# Patient Record
Sex: Female | Born: 1980 | Race: White | Hispanic: No | Marital: Married | Smoking: Former smoker
Health system: Southern US, Community
[De-identification: ages and names within clinical notes are randomized; demographics above are authoritative.]

## PROBLEM LIST (undated history)

## (undated) DIAGNOSIS — D649 Anemia, unspecified: Secondary | ICD-10-CM

## (undated) DIAGNOSIS — E039 Hypothyroidism, unspecified: Secondary | ICD-10-CM

## (undated) DIAGNOSIS — E785 Hyperlipidemia, unspecified: Secondary | ICD-10-CM

## (undated) HISTORY — PX: ACHILLES TENDON LENGTHENING: SUR826

## (undated) HISTORY — PX: ORIF FEMORAL SHAFT FRACTURE W/ PLATES AND SCREWS: SUR931

## (undated) HISTORY — PX: LAPAROSCOPIC GASTRIC BYPASS: SUR771

## (undated) HISTORY — DX: Hyperlipidemia, unspecified: E78.5

## (undated) HISTORY — DX: Anemia, unspecified: D64.9

## (undated) HISTORY — PX: BREAST REDUCTION SURGERY: SHX8

## (undated) HISTORY — PX: CHOLECYSTECTOMY: SHX55

## (undated) HISTORY — DX: Hypothyroidism, unspecified: E03.9

---

## 2014-01-29 ENCOUNTER — Ambulatory Visit: Payer: Self-pay | Admitting: Specialist

## 2014-01-29 DIAGNOSIS — Z01810 Encounter for preprocedural cardiovascular examination: Secondary | ICD-10-CM

## 2014-05-11 ENCOUNTER — Ambulatory Visit: Payer: Self-pay | Admitting: Specialist

## 2014-05-11 LAB — BASIC METABOLIC PANEL
Anion Gap: 6 — ABNORMAL LOW (ref 7–16)
BUN: 14 mg/dL (ref 7–18)
CO2: 26 mmol/L (ref 21–32)
CREATININE: 0.86 mg/dL (ref 0.60–1.30)
Calcium, Total: 8.6 mg/dL (ref 8.5–10.1)
Chloride: 108 mmol/L — ABNORMAL HIGH (ref 98–107)
EGFR (African American): >60
EGFR (Non-African Amer.): >60
Glucose: 108 mg/dL — ABNORMAL HIGH (ref 65–99)
Osmolality: 280 (ref 275–301)
Potassium: 4.1 mmol/L (ref 3.5–5.1)
SODIUM: 140 mmol/L (ref 136–145)

## 2014-05-25 ENCOUNTER — Inpatient Hospital Stay: Payer: Self-pay | Admitting: Specialist

## 2014-05-26 LAB — BASIC METABOLIC PANEL
Anion Gap: 9 (ref 7–16)
BUN: 6 mg/dL — AB (ref 7–18)
CALCIUM: 8 mg/dL — AB (ref 8.5–10.1)
CREATININE: 0.76 mg/dL (ref 0.60–1.30)
Chloride: 106 mmol/L (ref 98–107)
Co2: 22 mmol/L (ref 21–32)
EGFR (Non-African Amer.): >60
GLUCOSE: 91 mg/dL (ref 65–99)
OSMOLALITY: 271 (ref 275–301)
Potassium: 3.9 mmol/L (ref 3.5–5.1)
SODIUM: 137 mmol/L (ref 136–145)

## 2014-05-26 LAB — CBC WITH DIFFERENTIAL/PLATELET
Basophil #: 0.1 10*3/uL (ref 0.0–0.1)
Basophil %: 0.6 %
Eosinophil #: 0 10*3/uL (ref 0.0–0.7)
Eosinophil %: 0 %
HCT: 38.3 % (ref 35.0–47.0)
HGB: 12.7 g/dL (ref 12.0–16.0)
Lymphocyte #: 1.4 10*3/uL (ref 1.0–3.6)
Lymphocyte %: 11.3 %
MCH: 28.7 pg (ref 26.0–34.0)
MCHC: 33.2 g/dL (ref 32.0–36.0)
MCV: 86 fL (ref 80–100)
Monocyte #: 0.9 x10 3/mm (ref 0.2–0.9)
Monocyte %: 7.5 %
NEUTROS ABS: 9.9 10*3/uL — AB (ref 1.4–6.5)
Neutrophil %: 80.6 %
Platelet: 217 10*3/uL (ref 150–440)
RBC: 4.44 10*6/uL (ref 3.80–5.20)
RDW: 12.7 % (ref 11.5–14.5)
WBC: 12.3 10*3/uL — ABNORMAL HIGH (ref 3.6–11.0)

## 2014-05-26 LAB — PHOSPHORUS: Phosphorus: 2.4 mg/dL — ABNORMAL LOW (ref 2.5–4.9)

## 2014-05-26 LAB — ALBUMIN: Albumin: 3.1 g/dL — ABNORMAL LOW (ref 3.4–5.0)

## 2014-05-26 LAB — MAGNESIUM: MAGNESIUM: 1.8 mg/dL

## 2015-02-19 NOTE — Op Note (Signed)
PATIENT NAME:  Sarah Hess, HOSEK MR#:  [id number] DATE OF BIRTH:  Jul 05, 1981  DATE OF PROCEDURE:  05/25/2014  PREOPERATIVE DIAGNOSIS: Morbid obesity.   POSTOPERATIVE DIAGNOSIS: Morbid obesity.   PROCEDURE: Laparoscopic Roux-en-Y gastric bypass.   SURGEON: Primus Bravo, M.D.   ASSISTANT: Anabel Halon, PA-C.   COMPLICATIONS: None.   ANESTHESIA: General endotracheal.   CLINICAL INDICATIONS: See history and physical.   DETAILS OF PROCEDURE: The patient was taken to the operating room and placed on the operating table in the supine position.  Appropriate monitors and supplemental oxygen were delivered.  Broad-spectrum IV antibiotics were administered.  Sequential stockings were placed.  The abdomen was prepped and draped in the usual sterile fashion after the smooth induction of general anesthesia.  The abdomen was accessed using a 5-ml optical trocar in the left upper quadrant.  Pneumoperitoneum was established without difficulty.  Multiple other ports were placed in preparation for gastric bypass.  The small bowel was identified at the ligament of Treitz and run for 50 cm distal to that point.  It was then divided using an Echelon white load stapler reinforced with seam guard.  The Harmonic scalpel was used to take down the mesentery to its base.  A 125 cm Roux limb and 50 cm biliopancreatic limb was then created and a side-to-side jejunojejunostomy was created in the following fashion.  A tacking stitch was used using 2-0 Surgidac suture.  An enterotomy was made in both limbs and the Echelon white load stapler was inserted to its hub, clamped, and fired for its full 6-cm length.  The ensuing defect was closed using interrupted sutures to retract the enterotomy up and stapled across with the Echelon blue-load stapler with good effect.  A baby's butt stitch was placed times one using 2-0 Surgidac suture.  A stay suture was placed anteriorly using a 2-0 Surgidac suture and the mesenteric defect was closed  using running 2-0 Surgidac suture.  The omentum was divided then in the midline to create a path for the Roux limb to be lifted and elevated up to the gastric pouch.  The liver retractor was placed and the liver was retracted anteriorly and cephalad.  The patient was placed in steep reverse Trendelenburg after the Roux limb was tacked to the underside of the stomach using a 2-0 Surgidac suture.  All structures were removed from the stomach.  The pars flaccida was taken down using a Harmonic scalpel to the second gastric vein.  At that point the Echelon gold load reinforced with seam guard was used to fire multiple times creating a small 30 to 45-ml pouch.  Hemostasis was excellent.  The posterior pouch was then freed of all fibrofatty tissue in preparation for anastomosis.  The small bowel Roux limb was then mobilized up into the upper abdomen and tacked to the left side of the pouch with an interrupted 2-0 Surgidac suture.  Enterotomies were made in the gastric pouch and in the small bowel and the Echelon blue load stapler was inserted to 28 mm and clamped.  This was then fired and the ensuing defect was closed using a layer of 2-0 Polysorb suture times one.  A 34 French blunt-tipped bougie was inserted trans-orally down past the anastomosis and the second layer of closure occurred with two running 2-0 Polysorb sutures from either side, creating a two-layer anastomosis.  The bougie was removed and the proximal Roux limb was clamped.  This was submerged underneath saline and endoscope was placed trans-orally and guided all  the way down to the anastomosis without difficulty.  The anastomosis was widely patent and hemostasis was excellent.  No leak was present under high-pressure insufflation with the proximal Roux limb clamp.  The endoscopes were removed and I re-gowned and re-gloved.  We took the omentum and draped that over the top of the anastomosis and sutured it in place using a 2-0 Surgidac suture.  The  Peterson's defect was then closed using running 2-0 Surgidac suture. A drain was placed in the left upper quadrant over the anastomosis of 19 [place] Blake.  The liver retractor and trocars were removed without incident.  No bleeding occurred from any of these sites.  The wounds were then closed using 4-0 Vicryl and Dermabond.  The patient tolerated the procedure well, arrived in the recovery room in stable condition, and will be admitted to my care.  ADDENDUM 1: No endoscopy was performed.   ADDENDUM 2: The leak test was performed through a 36 JamaicaFrench ViSiGi.   ADDENDUM 3: No significant hiatal hernia was present.    ____________________________ Primus BravoJon Telina Kleckley, MD jb:jr D: 05/25/2014 12:35:20 ET T: 05/25/2014 13:23:05 ET JOB#: [id number]  cc: Primus BravoJon Korbyn Chopin, MD, <Dictator> Feliciana RossettiGreg Grisso, M.D. Geoffry ParadiseJON M Federico Maiorino MD ELECTRONICALLY SIGNED 05/26/2014 8:48

## 2015-03-18 IMAGING — CR DG CHEST 2V
1 series · 2 of 2 positions shown · non-contrast
Comparison: None.

CLINICAL DATA: Preoperative evaluation.

EXAM:
CHEST  2 VIEW

[Series 1: pa · 0.17mm/px · 2 of 2 slices shown]
[im 1/2]
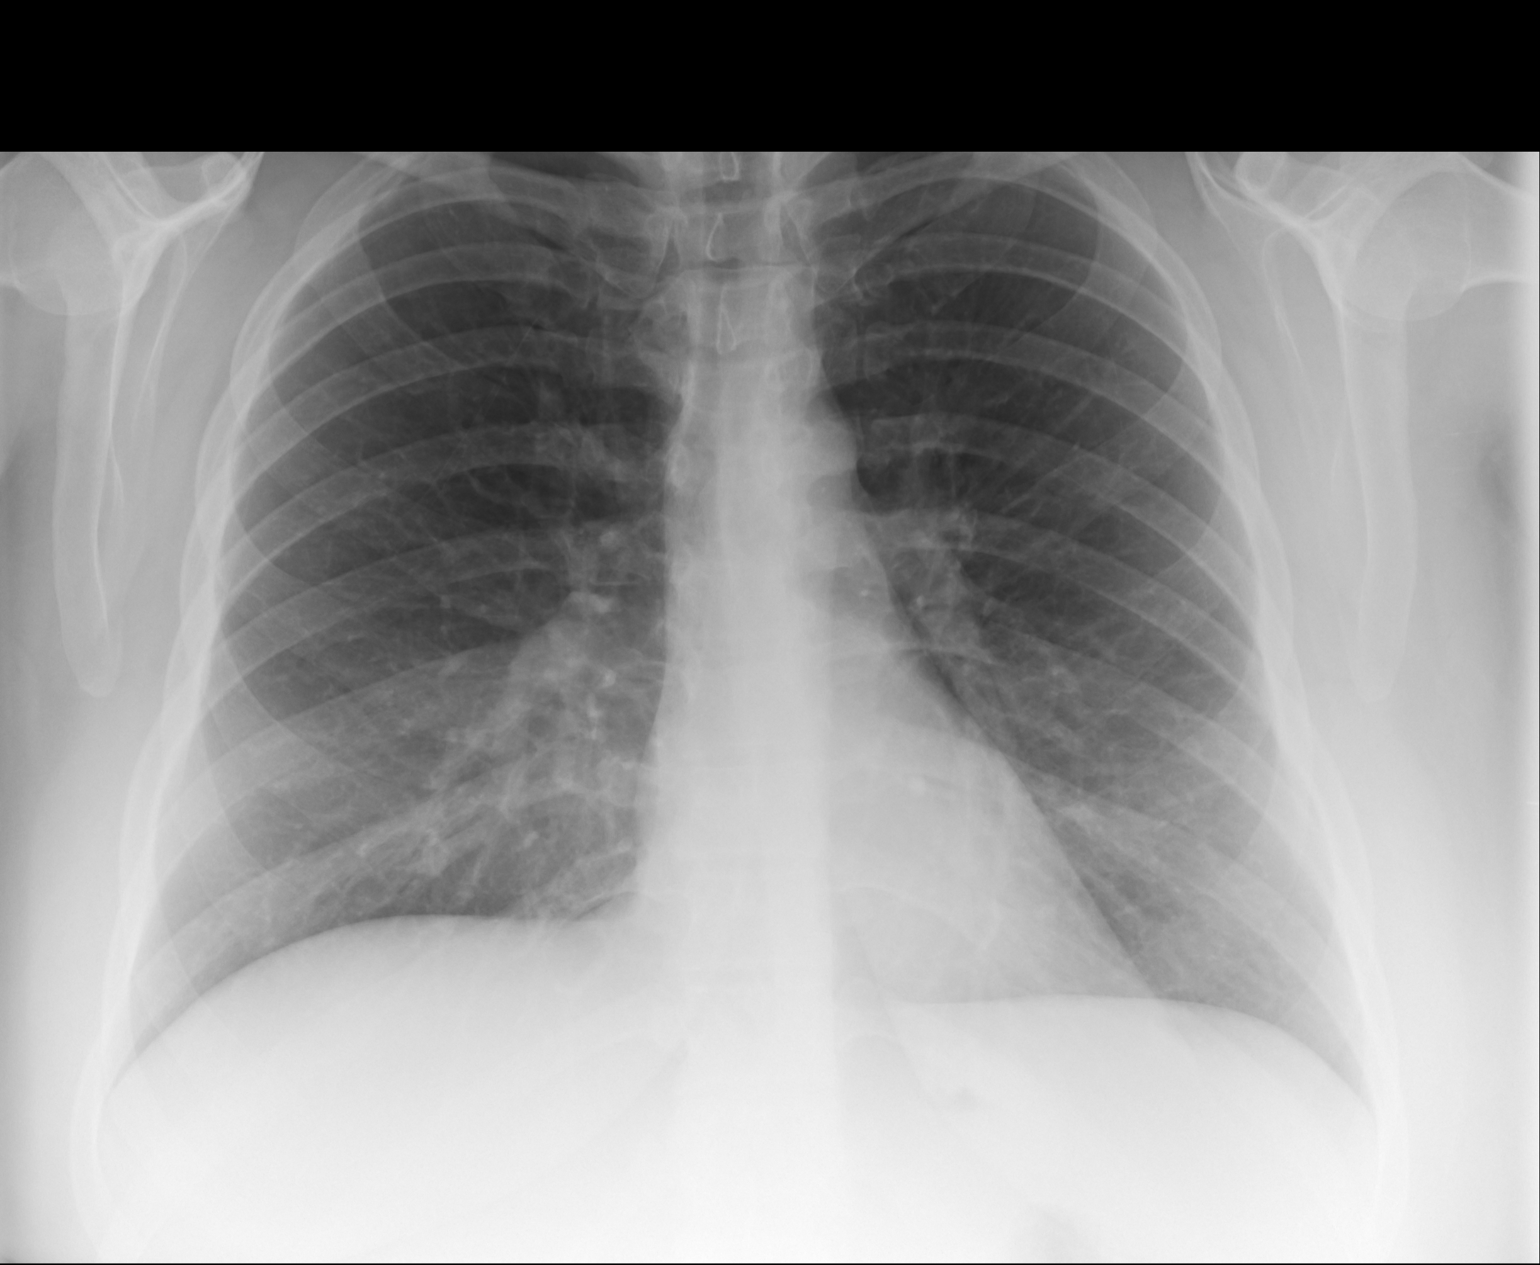
[im 2/2]
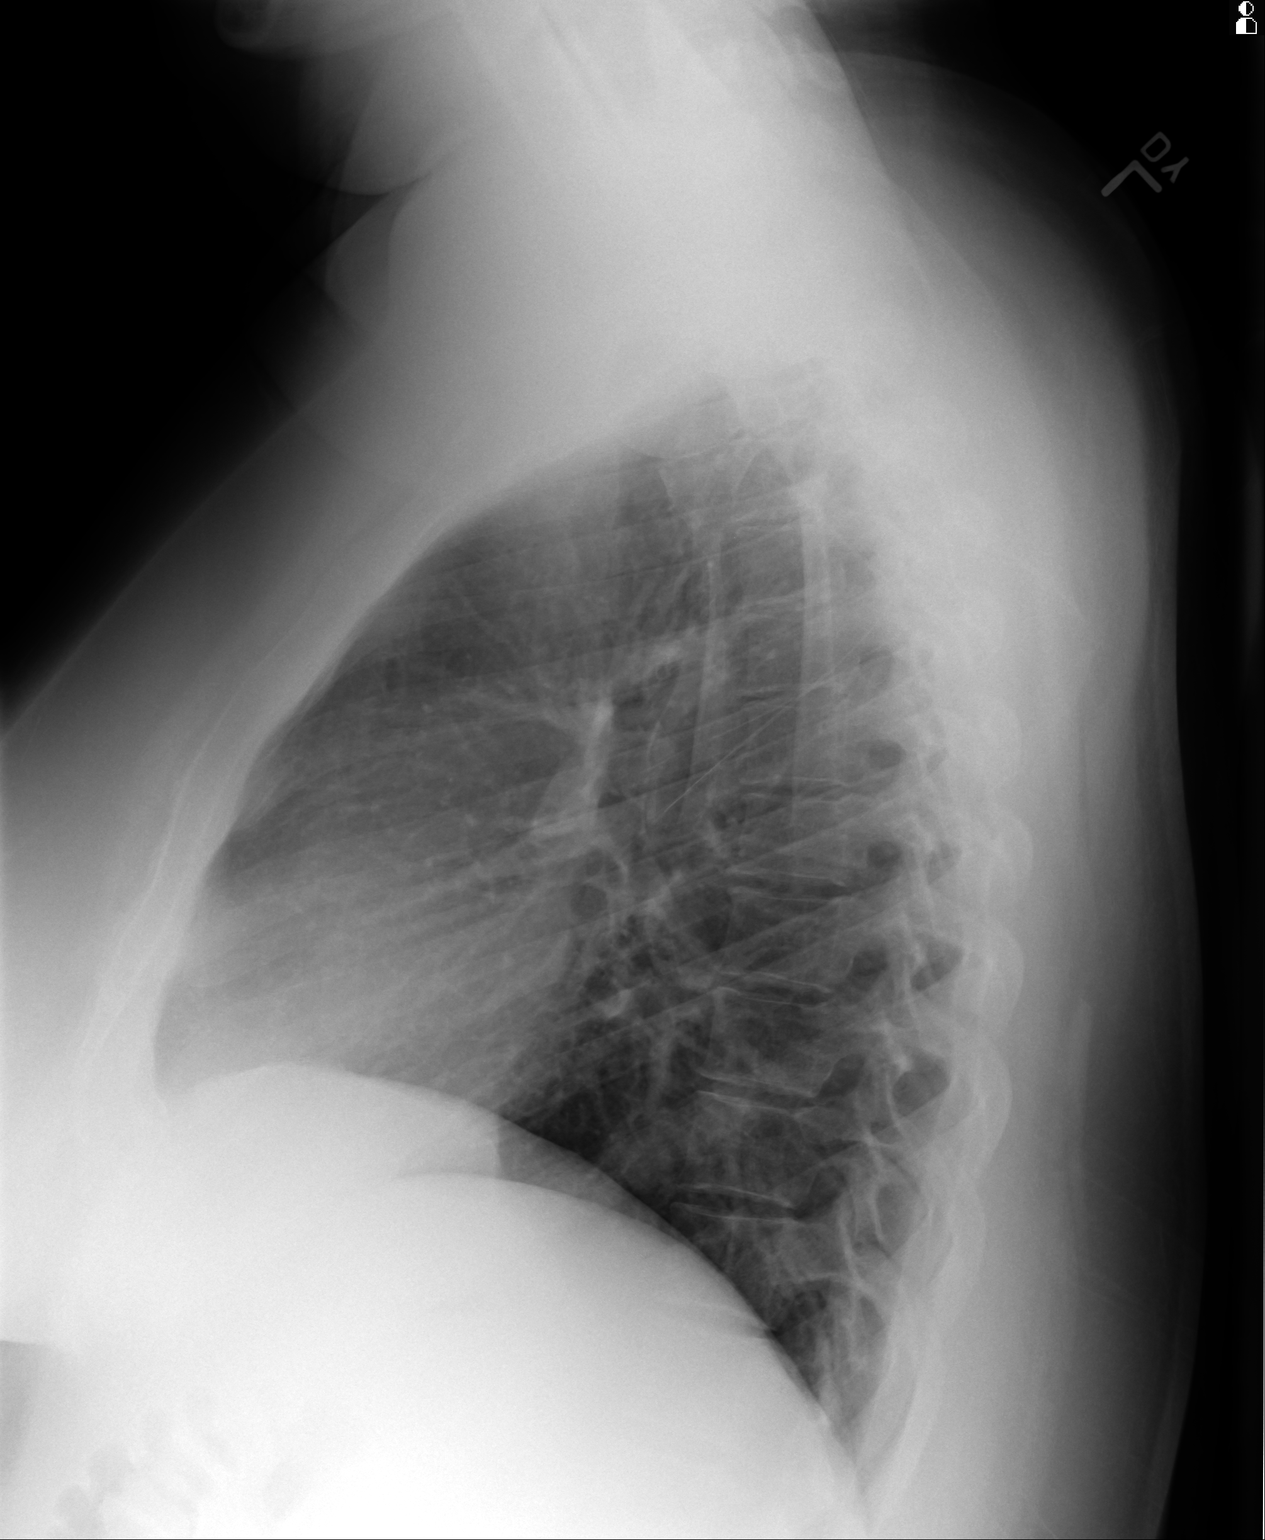

[2 of 2 positions shown; findings below may reference images not displayed]

FINDINGS: The heart size and mediastinal contours are within normal limits.
Both lungs are clear. The visualized skeletal structures are
unremarkable.
IMPRESSION: No active cardiopulmonary disease.

## 2015-07-15 ENCOUNTER — Other Ambulatory Visit: Payer: Self-pay | Admitting: Orthopaedic Surgery

## 2015-07-15 DIAGNOSIS — M5412 Radiculopathy, cervical region: Secondary | ICD-10-CM

## 2015-07-15 DIAGNOSIS — M542 Cervicalgia: Secondary | ICD-10-CM

## 2015-07-27 ENCOUNTER — Other Ambulatory Visit: Payer: Self-pay

## 2017-08-29 ENCOUNTER — Observation Stay (HOSPITAL_COMMUNITY): Payer: PRIVATE HEALTH INSURANCE | Admitting: Anesthesiology

## 2017-08-29 ENCOUNTER — Inpatient Hospital Stay (HOSPITAL_COMMUNITY)
Admission: AD | Admit: 2017-08-29 | Discharge: 2017-09-01 | DRG: 788 | Disposition: A | Discharge status: Home / Self Care | Payer: PRIVATE HEALTH INSURANCE | Source: Ambulatory Visit | Attending: Obstetrics and Gynecology | Admitting: Obstetrics and Gynecology

## 2017-08-29 ENCOUNTER — Encounter (HOSPITAL_COMMUNITY)
Admission: AD | Disposition: A | Discharge status: Home / Self Care | Payer: Self-pay | Source: Ambulatory Visit | Attending: Obstetrics and Gynecology

## 2017-08-29 ENCOUNTER — Encounter (HOSPITAL_COMMUNITY): Payer: Self-pay

## 2017-08-29 ENCOUNTER — Observation Stay (HOSPITAL_COMMUNITY): Payer: PRIVATE HEALTH INSURANCE

## 2017-08-29 DIAGNOSIS — O1414 Severe pre-eclampsia complicating childbirth: Principal | ICD-10-CM | POA: Diagnosis present

## 2017-08-29 DIAGNOSIS — E039 Hypothyroidism, unspecified: Secondary | ICD-10-CM | POA: Diagnosis present

## 2017-08-29 DIAGNOSIS — D649 Anemia, unspecified: Secondary | ICD-10-CM | POA: Diagnosis present

## 2017-08-29 DIAGNOSIS — O34211 Maternal care for low transverse scar from previous cesarean delivery: Secondary | ICD-10-CM | POA: Diagnosis present

## 2017-08-29 DIAGNOSIS — O9902 Anemia complicating childbirth: Secondary | ICD-10-CM | POA: Diagnosis present

## 2017-08-29 DIAGNOSIS — O365930 Maternal care for other known or suspected poor fetal growth, third trimester, not applicable or unspecified: Secondary | ICD-10-CM | POA: Diagnosis present

## 2017-08-29 DIAGNOSIS — Z3A29 29 weeks gestation of pregnancy: Secondary | ICD-10-CM | POA: Diagnosis not present

## 2017-08-29 DIAGNOSIS — Z87891 Personal history of nicotine dependence: Secondary | ICD-10-CM

## 2017-08-29 DIAGNOSIS — O99214 Obesity complicating childbirth: Secondary | ICD-10-CM | POA: Diagnosis present

## 2017-08-29 DIAGNOSIS — E669 Obesity, unspecified: Secondary | ICD-10-CM | POA: Diagnosis present

## 2017-08-29 DIAGNOSIS — Z7982 Long term (current) use of aspirin: Secondary | ICD-10-CM

## 2017-08-29 DIAGNOSIS — O1410 Severe pre-eclampsia, unspecified trimester: Secondary | ICD-10-CM | POA: Diagnosis present

## 2017-08-29 DIAGNOSIS — O1490 Unspecified pre-eclampsia, unspecified trimester: Secondary | ICD-10-CM | POA: Diagnosis present

## 2017-08-29 DIAGNOSIS — O328XX0 Maternal care for other malpresentation of fetus, not applicable or unspecified: Secondary | ICD-10-CM | POA: Diagnosis present

## 2017-08-29 DIAGNOSIS — O99844 Bariatric surgery status complicating childbirth: Secondary | ICD-10-CM | POA: Diagnosis present

## 2017-08-29 DIAGNOSIS — O99284 Endocrine, nutritional and metabolic diseases complicating childbirth: Secondary | ICD-10-CM | POA: Diagnosis present

## 2017-08-29 DIAGNOSIS — O1404 Mild to moderate pre-eclampsia, complicating childbirth: Secondary | ICD-10-CM | POA: Diagnosis present

## 2017-08-29 LAB — COMPREHENSIVE METABOLIC PANEL
ALT: 26 U/L (ref 14–54)
ANION GAP: 10 (ref 5–15)
AST: 32 U/L (ref 15–41)
Albumin: 2.8 g/dL — ABNORMAL LOW (ref 3.5–5.0)
Alkaline Phosphatase: 81 U/L (ref 38–126)
BUN: 14 mg/dL (ref 6–20)
CHLORIDE: 106 mmol/L (ref 101–111)
CO2: 20 mmol/L — AB (ref 22–32)
Calcium: 8.8 mg/dL — ABNORMAL LOW (ref 8.9–10.3)
Creatinine, Ser: 0.72 mg/dL (ref 0.44–1.00)
Glucose, Bld: 119 mg/dL — ABNORMAL HIGH (ref 65–99)
Potassium: 4.1 mmol/L (ref 3.5–5.1)
SODIUM: 136 mmol/L (ref 135–145)
Total Bilirubin: 0.2 mg/dL — ABNORMAL LOW (ref 0.3–1.2)
Total Protein: 6.4 g/dL — ABNORMAL LOW (ref 6.5–8.1)

## 2017-08-29 LAB — CBC
HCT: 29.8 % — ABNORMAL LOW (ref 36.0–46.0)
Hemoglobin: 10 g/dL — ABNORMAL LOW (ref 12.0–15.0)
MCH: 25.6 pg — AB (ref 26.0–34.0)
MCHC: 33.6 g/dL (ref 30.0–36.0)
MCV: 76.2 fL — ABNORMAL LOW (ref 78.0–100.0)
PLATELETS: 203 10*3/uL (ref 150–400)
RBC: 3.91 MIL/uL (ref 3.87–5.11)
RDW: 14.1 % (ref 11.5–15.5)
WBC: 14.6 10*3/uL — ABNORMAL HIGH (ref 4.0–10.5)

## 2017-08-29 LAB — T4, FREE: Free T4: 0.89 ng/dL (ref 0.61–1.12)

## 2017-08-29 SURGERY — Surgical Case
Anesthesia: Spinal | Wound class: Clean Contaminated

## 2017-08-29 MED ORDER — METHYLERGONOVINE MALEATE 0.2 MG PO TABS: 0.2000 mg | ORAL_TABLET | ORAL | Status: DC | PRN

## 2017-08-29 MED ORDER — DIPHENHYDRAMINE HCL 50 MG/ML IJ SOLN
12.5000 mg | INTRAMUSCULAR | Status: DC | PRN
  Administered 2017-08-29: 12.5 mg via INTRAVENOUS
  Filled 2017-08-29: qty 1

## 2017-08-29 MED ORDER — ZOLPIDEM TARTRATE 5 MG PO TABS: 5.0000 mg | ORAL_TABLET | Freq: Every evening | ORAL | Status: DC | PRN

## 2017-08-29 MED ORDER — LACTATED RINGERS IV SOLN
INTRAVENOUS | Status: DC | PRN
  Administered 2017-08-29: 40 [IU] via INTRAVENOUS

## 2017-08-29 MED ORDER — NALOXONE HCL 0.4 MG/ML IJ SOLN: 0.4000 mg | INTRAMUSCULAR | Status: DC | PRN

## 2017-08-29 MED ORDER — PHENYLEPHRINE 8 MG IN D5W 100 ML (0.08MG/ML) PREMIX OPTIME
INJECTION | INTRAVENOUS | Status: DC | PRN
  Administered 2017-08-29: 60 ug/min via INTRAVENOUS

## 2017-08-29 MED ORDER — FENTANYL CITRATE (PF) 100 MCG/2ML IJ SOLN
INTRAMUSCULAR | Status: AC
  Filled 2017-08-29: qty 2

## 2017-08-29 MED ORDER — SODIUM CHLORIDE 0.9 % IJ SOLN
INTRAMUSCULAR | Status: DC | PRN
  Administered 2017-08-29: 10 mL via INTRAVENOUS

## 2017-08-29 MED ORDER — BUPIVACAINE IN DEXTROSE 0.75-8.25 % IT SOLN
INTRATHECAL | Status: AC
  Filled 2017-08-29: qty 2

## 2017-08-29 MED ORDER — FENTANYL CITRATE (PF) 100 MCG/2ML IJ SOLN
INTRAMUSCULAR | Status: DC | PRN
  Administered 2017-08-29: 10 ug via INTRATHECAL
  Administered 2017-08-29: 80 ug via INTRAVENOUS

## 2017-08-29 MED ORDER — NALBUPHINE HCL 10 MG/ML IJ SOLN: 5.0000 mg | Freq: Once | INTRAMUSCULAR | Status: AC | PRN

## 2017-08-29 MED ORDER — ONDANSETRON HCL 4 MG/2ML IJ SOLN
INTRAMUSCULAR | Status: DC | PRN
  Administered 2017-08-29: 4 mg via INTRAVENOUS

## 2017-08-29 MED ORDER — OXYTOCIN 40 UNITS IN LACTATED RINGERS INFUSION - SIMPLE MED: 2.5000 [IU]/h | INTRAVENOUS | Status: AC

## 2017-08-29 MED ORDER — METHYLERGONOVINE MALEATE 0.2 MG/ML IJ SOLN: 0.2000 mg | INTRAMUSCULAR | Status: DC | PRN

## 2017-08-29 MED ORDER — DOCUSATE SODIUM 100 MG PO CAPS: 100.0000 mg | ORAL_CAPSULE | Freq: Every day | ORAL | Status: DC

## 2017-08-29 MED ORDER — SCOPOLAMINE 1 MG/3DAYS TD PT72: 1.0000 | MEDICATED_PATCH | Freq: Once | TRANSDERMAL | Status: DC

## 2017-08-29 MED ORDER — CEFAZOLIN SODIUM-DEXTROSE 2-4 GM/100ML-% IV SOLN
2.0000 g | INTRAVENOUS | Status: AC
  Administered 2017-08-29: 2 g via INTRAVENOUS
  Filled 2017-08-29: qty 100

## 2017-08-29 MED ORDER — SIMETHICONE 80 MG PO CHEW
80.0000 mg | CHEWABLE_TABLET | Freq: Three times a day (TID) | ORAL | Status: DC
  Administered 2017-08-30 – 2017-09-01 (×8): 80 mg via ORAL
  Filled 2017-08-29 (×8): qty 1

## 2017-08-29 MED ORDER — PROMETHAZINE HCL 25 MG/ML IJ SOLN: 6.2500 mg | INTRAMUSCULAR | Status: DC | PRN

## 2017-08-29 MED ORDER — DEXAMETHASONE SODIUM PHOSPHATE 10 MG/ML IJ SOLN
INTRAMUSCULAR | Status: AC
  Filled 2017-08-29: qty 1

## 2017-08-29 MED ORDER — DIBUCAINE 1 % RE OINT: 1.0000 "application " | TOPICAL_OINTMENT | RECTAL | Status: DC | PRN

## 2017-08-29 MED ORDER — DEXAMETHASONE SODIUM PHOSPHATE 10 MG/ML IJ SOLN
INTRAMUSCULAR | Status: DC | PRN
  Administered 2017-08-29: 10 mg via INTRAVENOUS

## 2017-08-29 MED ORDER — COCONUT OIL OIL
1.0000 "application " | TOPICAL_OIL | Status: DC | PRN
  Administered 2017-09-01: 1 via TOPICAL
  Filled 2017-08-29: qty 120

## 2017-08-29 MED ORDER — BUPIVACAINE HCL (PF) 0.25 % IJ SOLN
INTRAMUSCULAR | Status: DC | PRN
  Administered 2017-08-29: 30 mL

## 2017-08-29 MED ORDER — NALBUPHINE HCL 10 MG/ML IJ SOLN
5.0000 mg | Freq: Once | INTRAMUSCULAR | Status: AC | PRN
  Administered 2017-08-29: 5 mg via SUBCUTANEOUS

## 2017-08-29 MED ORDER — FENTANYL CITRATE (PF) 100 MCG/2ML IJ SOLN
25.0000 ug | INTRAMUSCULAR | Status: DC | PRN
  Administered 2017-08-29: 50 ug via INTRAVENOUS

## 2017-08-29 MED ORDER — LACTATED RINGERS IV SOLN: INTRAVENOUS | Status: DC

## 2017-08-29 MED ORDER — ONDANSETRON HCL 4 MG/2ML IJ SOLN
INTRAMUSCULAR | Status: AC
  Filled 2017-08-29: qty 2

## 2017-08-29 MED ORDER — LEVOTHYROXINE SODIUM 175 MCG PO TABS
175.0000 ug | ORAL_TABLET | Freq: Every day | ORAL | Status: DC
  Administered 2017-08-30 – 2017-09-01 (×3): 175 ug via ORAL
  Filled 2017-08-29 (×4): qty 1

## 2017-08-29 MED ORDER — MIDAZOLAM HCL 2 MG/2ML IJ SOLN
INTRAMUSCULAR | Status: AC
  Filled 2017-08-29: qty 2

## 2017-08-29 MED ORDER — ONDANSETRON HCL 4 MG/2ML IJ SOLN: 4.0000 mg | Freq: Three times a day (TID) | INTRAMUSCULAR | Status: DC | PRN

## 2017-08-29 MED ORDER — NALBUPHINE HCL 10 MG/ML IJ SOLN: 5.0000 mg | INTRAMUSCULAR | Status: DC | PRN

## 2017-08-29 MED ORDER — FAMOTIDINE 20 MG PO TABS
20.0000 mg | ORAL_TABLET | Freq: Once | ORAL | Status: AC
  Administered 2017-08-29: 20 mg via ORAL
  Filled 2017-08-29: qty 1

## 2017-08-29 MED ORDER — SODIUM CHLORIDE 0.9 % IR SOLN
Status: DC | PRN
  Administered 2017-08-29: 1000 mL

## 2017-08-29 MED ORDER — OXYCODONE-ACETAMINOPHEN 5-325 MG PO TABS
2.0000 | ORAL_TABLET | ORAL | Status: DC | PRN
Start: 2017-08-29 — End: 2017-09-01
  Administered 2017-08-30: 2 via ORAL
  Filled 2017-08-29: qty 2

## 2017-08-29 MED ORDER — MAGNESIUM SULFATE BOLUS VIA INFUSION
4.0000 g | Freq: Once | INTRAVENOUS | Status: AC
  Administered 2017-08-29: 4 g via INTRAVENOUS
  Filled 2017-08-29: qty 500

## 2017-08-29 MED ORDER — MEPERIDINE HCL 25 MG/ML IJ SOLN: 6.2500 mg | INTRAMUSCULAR | Status: DC | PRN

## 2017-08-29 MED ORDER — IBUPROFEN 600 MG PO TABS
600.0000 mg | ORAL_TABLET | Freq: Four times a day (QID) | ORAL | Status: DC
  Administered 2017-08-29 – 2017-09-01 (×10): 600 mg via ORAL
  Filled 2017-08-29 (×11): qty 1

## 2017-08-29 MED ORDER — MAGNESIUM SULFATE 40 G IN LACTATED RINGERS - SIMPLE
2.0000 g/h | INTRAVENOUS | Status: DC
  Administered 2017-08-29: 2 g/h via INTRAVENOUS
  Filled 2017-08-29: qty 40

## 2017-08-29 MED ORDER — SIMETHICONE 80 MG PO CHEW
80.0000 mg | CHEWABLE_TABLET | ORAL | Status: DC
  Administered 2017-08-29 – 2017-09-01 (×3): 80 mg via ORAL
  Filled 2017-08-29 (×3): qty 1

## 2017-08-29 MED ORDER — PRENATAL MULTIVITAMIN CH: 1.0000 | ORAL_TABLET | Freq: Every day | ORAL | Status: DC

## 2017-08-29 MED ORDER — ACETAMINOPHEN 325 MG PO TABS: 650.0000 mg | ORAL_TABLET | ORAL | Status: DC | PRN

## 2017-08-29 MED ORDER — NALOXONE HCL 2 MG/2ML IJ SOSY: 1.0000 ug/kg/h | PREFILLED_SYRINGE | INTRAVENOUS | Status: DC | PRN

## 2017-08-29 MED ORDER — SOD CITRATE-CITRIC ACID 500-334 MG/5ML PO SOLN
ORAL | Status: AC
  Administered 2017-08-29: 30 mL
  Filled 2017-08-29: qty 15

## 2017-08-29 MED ORDER — OXYCODONE-ACETAMINOPHEN 5-325 MG PO TABS
1.0000 | ORAL_TABLET | ORAL | Status: DC | PRN
  Administered 2017-08-30 – 2017-08-31 (×4): 1 via ORAL
  Filled 2017-08-29 (×4): qty 1

## 2017-08-29 MED ORDER — SCOPOLAMINE 1 MG/3DAYS TD PT72
MEDICATED_PATCH | TRANSDERMAL | Status: AC
  Filled 2017-08-29: qty 1

## 2017-08-29 MED ORDER — DIPHENHYDRAMINE HCL 25 MG PO CAPS
25.0000 mg | ORAL_CAPSULE | ORAL | Status: DC | PRN
  Administered 2017-08-30 (×2): 25 mg via ORAL
  Filled 2017-08-29 (×2): qty 1

## 2017-08-29 MED ORDER — LABETALOL HCL 100 MG PO TABS
100.0000 mg | ORAL_TABLET | Freq: Three times a day (TID) | ORAL | Status: DC
  Administered 2017-08-29 – 2017-08-31 (×6): 100 mg via ORAL
  Filled 2017-08-29 (×8): qty 1

## 2017-08-29 MED ORDER — BUPIVACAINE HCL (PF) 0.25 % IJ SOLN
INTRAMUSCULAR | Status: AC
  Filled 2017-08-29: qty 30

## 2017-08-29 MED ORDER — PRENATAL MULTIVITAMIN CH
1.0000 | ORAL_TABLET | Freq: Every day | ORAL | Status: DC
  Administered 2017-08-30 – 2017-09-01 (×3): 1 via ORAL
  Filled 2017-08-29 (×3): qty 1

## 2017-08-29 MED ORDER — SODIUM CHLORIDE 0.9% FLUSH: 3.0000 mL | INTRAVENOUS | Status: DC | PRN

## 2017-08-29 MED ORDER — CALCIUM CARBONATE ANTACID 500 MG PO CHEW: 2.0000 | CHEWABLE_TABLET | ORAL | Status: DC | PRN

## 2017-08-29 MED ORDER — SOD CITRATE-CITRIC ACID 500-334 MG/5ML PO SOLN: 30.0000 mL | Freq: Once | ORAL | Status: DC

## 2017-08-29 MED ORDER — DIPHENHYDRAMINE HCL 25 MG PO CAPS
25.0000 mg | ORAL_CAPSULE | Freq: Four times a day (QID) | ORAL | Status: DC | PRN
  Administered 2017-08-30: 25 mg via ORAL
  Filled 2017-08-29 (×3): qty 1

## 2017-08-29 MED ORDER — MORPHINE SULFATE (PF) 0.5 MG/ML IJ SOLN
INTRAMUSCULAR | Status: AC
  Filled 2017-08-29: qty 10

## 2017-08-29 MED ORDER — MIDAZOLAM HCL 2 MG/2ML IJ SOLN
INTRAMUSCULAR | Status: DC | PRN
  Administered 2017-08-29: 2 mg via INTRAVENOUS

## 2017-08-29 MED ORDER — SENNOSIDES-DOCUSATE SODIUM 8.6-50 MG PO TABS
2.0000 | ORAL_TABLET | ORAL | Status: DC
  Administered 2017-08-29 – 2017-09-01 (×3): 2 via ORAL
  Filled 2017-08-29 (×3): qty 2

## 2017-08-29 MED ORDER — MORPHINE SULFATE (PF) 0.5 MG/ML IJ SOLN
INTRAMUSCULAR | Status: DC | PRN
  Administered 2017-08-29: 1 mg via EPIDURAL
  Administered 2017-08-29: .2 mg via INTRATHECAL
  Administered 2017-08-29 (×2): 1 mg via EPIDURAL

## 2017-08-29 MED ORDER — OXYTOCIN 10 UNIT/ML IJ SOLN
INTRAMUSCULAR | Status: AC
  Filled 2017-08-29: qty 4

## 2017-08-29 MED ORDER — NALBUPHINE HCL 10 MG/ML IJ SOLN
INTRAMUSCULAR | Status: AC
  Filled 2017-08-29: qty 1

## 2017-08-29 MED ORDER — MENTHOL 3 MG MT LOZG: 1.0000 | LOZENGE | OROMUCOSAL | Status: DC | PRN

## 2017-08-29 MED ORDER — LACTATED RINGERS IV SOLN
INTRAVENOUS | Status: DC | PRN
  Administered 2017-08-29 (×2): via INTRAVENOUS

## 2017-08-29 MED ORDER — WITCH HAZEL-GLYCERIN EX PADS: 1.0000 "application " | MEDICATED_PAD | CUTANEOUS | Status: DC | PRN

## 2017-08-29 MED ORDER — ACETAMINOPHEN 500 MG PO TABS
1000.0000 mg | ORAL_TABLET | Freq: Four times a day (QID) | ORAL | Status: AC
  Administered 2017-08-29 – 2017-08-30 (×3): 1000 mg via ORAL
  Filled 2017-08-29 (×3): qty 2

## 2017-08-29 MED ORDER — ACETAMINOPHEN 325 MG PO TABS
650.0000 mg | ORAL_TABLET | ORAL | Status: DC | PRN
  Administered 2017-09-01: 650 mg via ORAL
  Filled 2017-08-29: qty 2

## 2017-08-29 MED ORDER — TETANUS-DIPHTH-ACELL PERTUSSIS 5-2.5-18.5 LF-MCG/0.5 IM SUSP: 0.5000 mL | Freq: Once | INTRAMUSCULAR | Status: DC

## 2017-08-29 MED ORDER — SIMETHICONE 80 MG PO CHEW: 80.0000 mg | CHEWABLE_TABLET | ORAL | Status: DC | PRN

## 2017-08-29 MED ORDER — SCOPOLAMINE 1 MG/3DAYS TD PT72
MEDICATED_PATCH | TRANSDERMAL | Status: DC | PRN
  Administered 2017-08-29: 1 via TRANSDERMAL

## 2017-08-29 SURGICAL SUPPLY — 38 items
CHLORAPREP W/TINT 26ML (MISCELLANEOUS) ×3 IMPLANT
CLAMP CORD UMBIL (MISCELLANEOUS) IMPLANT
CLOTH BEACON ORANGE TIMEOUT ST (SAFETY) ×3 IMPLANT
CONTAINER PREFILL 10% NBF 15ML (MISCELLANEOUS) IMPLANT
DERMABOND ADVANCED (GAUZE/BANDAGES/DRESSINGS) ×2
DERMABOND ADVANCED .7 DNX12 (GAUZE/BANDAGES/DRESSINGS) ×1 IMPLANT
DRSG OPSITE POSTOP 4X10 (GAUZE/BANDAGES/DRESSINGS) ×3 IMPLANT
ELECT REM PT RETURN 9FT ADLT (ELECTROSURGICAL) ×3
ELECTRODE REM PT RTRN 9FT ADLT (ELECTROSURGICAL) ×1 IMPLANT
EXTRACTOR VACUUM M CUP 4 TUBE (SUCTIONS) IMPLANT
EXTRACTOR VACUUM M CUP 4' TUBE (SUCTIONS)
GAUZE SPONGE 4X4 12PLY STRL LF (GAUZE/BANDAGES/DRESSINGS) ×6 IMPLANT
GLOVE BIO SURGEON STRL SZ7.5 (GLOVE) ×3 IMPLANT
GLOVE BIOGEL PI IND STRL 7.0 (GLOVE) ×1 IMPLANT
GLOVE BIOGEL PI INDICATOR 7.0 (GLOVE) ×2
GOWN STRL REUS W/TWL LRG LVL3 (GOWN DISPOSABLE) ×6 IMPLANT
KIT ABG SYR 3ML LUER SLIP (SYRINGE) IMPLANT
NEEDLE HYPO 22GX1.5 SAFETY (NEEDLE) ×3 IMPLANT
NEEDLE HYPO 25X5/8 SAFETYGLIDE (NEEDLE) ×3 IMPLANT
NEEDLE SPNL 20GX3.5 QUINCKE YW (NEEDLE) IMPLANT
NS IRRIG 1000ML POUR BTL (IV SOLUTION) ×3 IMPLANT
PACK C SECTION WH (CUSTOM PROCEDURE TRAY) ×3 IMPLANT
PAD ABD 7.5X8 STRL (GAUZE/BANDAGES/DRESSINGS) ×3 IMPLANT
PENCIL SMOKE EVAC W/HOLSTER (ELECTROSURGICAL) ×3 IMPLANT
SUT MNCRL 0 VIOLET CTX 36 (SUTURE) ×2 IMPLANT
SUT MNCRL AB 3-0 PS2 27 (SUTURE) ×3 IMPLANT
SUT MON AB 2-0 CT1 27 (SUTURE) ×3 IMPLANT
SUT MON AB-0 CT1 36 (SUTURE) ×6 IMPLANT
SUT MONOCRYL 0 CTX 36 (SUTURE) ×4
SUT PLAIN 0 NONE (SUTURE) IMPLANT
SUT PLAIN 2 0 (SUTURE)
SUT PLAIN 2 0 XLH (SUTURE) ×3 IMPLANT
SUT PLAIN ABS 2-0 CT1 27XMFL (SUTURE) IMPLANT
SYR 20CC LL (SYRINGE) IMPLANT
SYR CONTROL 10ML LL (SYRINGE) ×3 IMPLANT
TOWEL OR 17X24 6PK STRL BLUE (TOWEL DISPOSABLE) ×3 IMPLANT
TRAY FOLEY BAG SILVER LF 14FR (SET/KITS/TRAYS/PACK) ×3 IMPLANT
YANKAUER SUCT BULB TIP NO VENT (SUCTIONS) ×3 IMPLANT

## 2017-08-29 NOTE — Consult Note (Signed)
Maternal Fetal Medicine Consultation  Requesting Provider(s):Taavon Primary WU:[id number]:Taavon Reason for consultation: preeclampsia  HPI: 35yo P0101 at 29+0 weeks admitted with preeclampsia. She had normal BP (110-120s/60-70s) until 10/25, at which point they were 142/96. Betamethasone was given, a 24 hour urine was obtained and was very abnormal at 2800mg  protein in 24 hours. CBC and LFTs were normal yesterday, and are pending today. US by report shows low normal growth but AC <3rd percentile. This pregnancy was an ART pregnancy. NIPT and AFP were normal. ABS is positive for a cold agglutinin, titers very low throughout pregnancy Patient reports occasional headaches and prominent scotomata. Denies epigastric and RUQ pain  OB History: 35 week C/S for preeclampsia with oligohydramnios   PMH: Hypothyroid on 175mcg synthroid. Most recent TSH very elevated at 7.76 last week  PSH: ORIF, cholecystectomy, gastric bypass Meds: 81mg  ASA daily, 175mcg synthroid daily, PNV daily. 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 All other systems reviewed and are negative.  PE:  VS: See EPIC section GEN: well-appearing female ABD: gravid, NT NEURO: dtr 1+ at patella and biceps, 2 beats ankle clonus  A/P: Patient clearly has preeclampsia. She has a good understanding of the disease and its management after her first pregnancy. She understands she will probably need to be inhouse until delivery. Recommend platelets and LFTs every 3 days if today's are normal. 1 hours of EFM 2-3 times per day Neuroprotective MgSO4 if delivery is imminent prior to 32 weeks Full US scan with BPP on Monday 11/5 and begin weekly BPP at that point, dopplers [facility] low as described Draw free T4 ASAP due to highly elevated TSH  Thank you for the opportunity to be  a part of the care of Sarah Hess. Please contact our office if we can be of further assistance.   I spent approximately 30 minutes with this patient with over 50% of time spent in face-to-face counseling.

## 2017-08-29 NOTE — Transfer of Care (Signed)
Immediate Anesthesia Transfer of Care Note  Patient: Sarah Hess  Procedure(s) Performed: CESAREAN SECTION (N/A )  Patient Location: PACU  Anesthesia Type:Spinal  Level of Consciousness: awake, alert  and oriented  Airway & Oxygen Therapy: Patient Spontanous Breathing  Post-op Assessment: Report given to RN and Post -op Vital signs reviewed and stable  Post vital signs: Reviewed and stable  Last Vitals:  Vitals:   08/29/17 1505 08/29/17 1510  BP: 138/76 127/73  Pulse: 72 (!) 58  Resp: 16 18  Temp:    SpO2:      Last Pain:  Vitals:   08/29/17 1400  TempSrc:   PainSc: 0-No pain         Complications: No apparent anesthesia complications

## 2017-08-29 NOTE — Progress Notes (Signed)
Anesthesia notified in regards to c/s decision.  Carmelina DaneERRI L Deniel Mcquiston, RN

## 2017-08-29 NOTE — Anesthesia Procedure Notes (Signed)
Spinal  Patient location during procedure: OR Start time: 08/29/2017 4:05 PM End time: 08/29/2017 4:07 PM Staffing Anesthesiologist: Cecile HearingURK, Esmerelda Finnigan EDWARD Performed: anesthesiologist  Preanesthetic Checklist Completed: patient identified, surgical consent, pre-op evaluation, timeout performed, IV checked, risks and benefits discussed and monitors and equipment checked Spinal Block Patient position: sitting Prep: site prepped and draped and DuraPrep Patient monitoring: continuous pulse ox and blood pressure Approach: midline Location: L3-4 Injection technique: single-shot Needle Needle type: Pencan  Needle gauge: 24 G Assessment Sensory level: T6 Additional Notes Functioning IV was confirmed and monitors were applied. Sterile prep and drape, including hand hygiene, mask and sterile gloves were used. The patient was positioned and the spine was prepped. The skin was anesthetized with lidocaine.  Free flow of clear CSF was obtained prior to injecting local anesthetic into the CSF.  The spinal needle aspirated freely following injection.  The needle was carefully withdrawn.  The patient tolerated the procedure well. Consent was obtained prior to procedure with all questions answered and concerns addressed. Risks including but not limited to bleeding, infection, nerve damage, paralysis, failed block, inadequate analgesia, allergic reaction, high spinal, itching and headache were discussed and the patient wished to proceed.   Arrie AranStephen Ramesh Moan, MD

## 2017-08-29 NOTE — Progress Notes (Signed)
Dr. Billy Coastaavon notified of variable decels. No new orders given. Carmelina DaneERRI L Isahi Godwin, RN

## 2017-08-29 NOTE — H&P (Signed)
NAMLevora Angel:  Sarah Hess, Sarah Hess             ACCOUNT NO.:  [id number]  MEDICAL RECORD NO.:  [id number]  LOCATION:                                 FACILITY:  PHYSICIAN:  Lenoard Adenichard J. Viyaan Champine, M.D.     DATE OF BIRTH:  DATE OF ADMISSION:  08/29/2017 DATE OF DISCHARGE:                             HISTORY & PHYSICAL   ADMISSION DIAGNOSIS:  A 29-week intrauterine pregnancy with new onset preeclampsia without severe features.  The patient presented to the office on August 22, 2017, with elevated blood pressure of first time is pregnancy of 142/96, had negative preeclamptic labs, did a 24-hour urine that came back greater than 300 mg in a 24-hour specimen.  She was given betamethasone on October 29 and October 30, to follow up in the office on October 31 revealed blood pressure of 154/96 with an appropriately grown fetus and a lagging abdominal circumference in the 2nd percentile.  She is admitted at this time for further management and decision making regarding inpatient management, outpatient management and possible delivery.  ALLERGIES:  She has no known drug allergies.  MEDICATIONS:  Labetalol, Synthroid, baby aspirin, prenatal vitamins.  SOCIAL HISTORY:  She is a nonsmoker, nondrinker.  She denies domestic physical violence.  She has a previous history of an urgent C-section at 36 weeks for severe preeclampsia, oligohydramnios in her first pregnancy in 2013.  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  FAMILY HISTORY:  Remarkable for diabetes and heart disease.  PHYSICAL EXAMINATION:  GENERAL:  She is a well-developed, well-nourished white female, in no acute distress. HEENT:  Normal. NECK:  Supple.  Full range of motion. LUNGS:  Clear. HEART:   Regular rate and rhythm. ABDOMEN:  Soft, gravid, nontender. EXTREMITIES:  There are no cords. NEUROLOGIC:  Nonfocal. SKIN:  Intact.  DTRs 2+.  No evidence of clonus.  REVIEW OF SYSTEMS:  Otherwise negative.  The patient denies headache, visual symptoms, has had some occasional nausea and vomiting.  IMPRESSION: 1. Twenty-nine week intrauterine pregnancy. 2. New onset preeclampsia without severe features. 3. History of severe preeclampsia. 4. History of previous cesarean section. 5. Hypothyroidism, poorly controlled.  PLAN:  To admit.  MFM consult.  NSTs 3 times a day.  Monitor fetal surveillance closely.  Consider magnesium neuro prophylaxis. Betamethasone complete.  Delivery options discussed.     Lenoard Adenichard J. Davonta Stroot, M.D.     RJT/MEDQ  D:  08/29/2017  T:  08/29/2017  Job:  [id number]

## 2017-08-29 NOTE — Op Note (Signed)
Cesarean Section Procedure Note  Indications: malpresentation: Footling breech and non-reassuring fetal status  Pre-operative Diagnosis: 29 week 0 day pregnancy.  Post-operative Diagnosis: same  Surgeon: Lenoard AdenAAVON,Zanaya Baize J   Assistants: Ernestina PennaFogleman, MD  Anesthesia: Local anesthesia 0.25.% bupivacaine and Spinal anesthesia  ASA Class: 2  Procedure Details  The patient was seen in the Holding Room. The risks, benefits, complications, treatment options, and expected outcomes were discussed with the patient.  The patient concurred with the proposed plan, giving informed consent. The risks of anesthesia, infection, bleeding and possible injury to other organs discussed. Injury to bowel, bladder, or ureter with possible need for repair discussed. Possible need for transfusion with secondary risks of hepatitis or HIV acquisition discussed. Post operative complications to include but not limited to DVT, PE and Pneumonia noted. The site of surgery properly noted/marked. The patient was taken to Operating Room # 1, identified as Bradly BienenstockDanielle Gallaway and the procedure verified as C-Section Delivery. A Time Out was held and the above information confirmed.  After induction of anesthesia, the patient was draped and prepped in the usual sterile manner. A Pfannenstiel incision was made and carried down through the subcutaneous tissue to the fascia. Fascial incision was made and extended transversely using Mayo scissors. The fascia was separated from the underlying rectus tissue superiorly and inferiorly. The peritoneum was identified and entered. Peritoneal incision was extended longitudinally. The utero-vesical peritoneal reflection was incised transversely and the bladder flap was bluntly freed from the lower uterine segment. A low transverse uterine incision(Kerr hysterotomy) was made. Delivered from footling breech presentation was a  female with Apgar scores of pending at one minute and pending at five minutes. Bulb  suctioning gently performed. Neonatal team in attendance and newborn transported to NICU.After the umbilical cord was clamped (delayed) and cut cord blood was obtained for evaluation. Cord ph collected. The placenta was removed intact and appeared normal. The uterus was curetted with a dry lap pack. Good hemostasis was noted.The uterine outline, tubes and ovaries appeared normal. The uterine incision was closed with running locked sutures of 0 Monocryl x 2 layers. Hemostasis was observed. Lavage was carried out until clear.The parietal peritoneum was closed with a running 2-0 Monocryl suture. The fascia was then reapproximated with running sutures of 0 Monocryl. The skin was reapproximated with 3-0 monocryl after Byron closure with 2-0 plain.  Instrument, sponge, and needle counts were correct prior the abdominal closure and at the conclusion of the case.   Findings: As noted  Estimated Blood Loss:  600         Drains: foley                 Specimens: placenta to path                 Complications:  None; patient tolerated the procedure well.         Disposition: PACU - hemodynamically stable.         Condition: stable  Attending Attestation: I performed the procedure.

## 2017-08-29 NOTE — Anesthesia Preprocedure Evaluation (Signed)
Anesthesia Evaluation  Patient identified by MRN, date of birth, ID band Patient awake    Reviewed: Allergy & Precautions, NPO status , Patient's Chart, lab work & pertinent test results, reviewed documented beta blocker date and time   Airway Mallampati: II  TM Distance: >3 FB Neck ROM: Full    Dental  (+) Teeth Intact, Dental Advisory Given   Pulmonary neg pulmonary ROS, former smoker,    Pulmonary exam normal breath sounds clear to auscultation       Cardiovascular hypertension (Pre-eclampsia), Pt. on home beta blockers Normal cardiovascular exam Rhythm:Regular Rate:Normal     Neuro/Psych negative neurological ROS     GI/Hepatic negative GI ROS, Neg liver ROS,   Endo/Other  Hypothyroidism Obesity   Renal/GU negative Renal ROS     Musculoskeletal negative musculoskeletal ROS (+)   Abdominal   Peds  Hematology negative hematology ROS (+) Blood dyscrasia, anemia , Plt 203k   Anesthesia Other Findings Day of surgery medications reviewed with the patient.  Reproductive/Obstetrics (+) Pregnancy 29 wks G2P0101, IUGR, preeclampsia                             Anesthesia Physical Anesthesia Plan  ASA: III and emergent  Anesthesia Plan: Spinal   Post-op Pain Management:    Induction:   PONV Risk Score and Plan: 3 and Ondansetron, Dexamethasone, Scopolamine patch - Pre-op and Treatment may vary due to age or medical condition  Airway Management Planned:   Additional Equipment:   Intra-op Plan:   Post-operative Plan:   Informed Consent: I have reviewed the patients History and Physical, chart, labs and discussed the procedure including the risks, benefits and alternatives for the proposed anesthesia with the patient or authorized representative who has indicated his/her understanding and acceptance.   Dental advisory given  Plan Discussed with: CRNA, Anesthesiologist and  Surgeon  Anesthesia Plan Comments: (Discussed risks and benefits of and differences between spinal and general. Discussed risks of spinal including headache, backache, failure, bleeding, infection, and nerve damage. Patient consents to spinal. Questions answered. Coagulation studies and platelet count acceptable.)        Anesthesia Quick Evaluation

## 2017-08-29 NOTE — Progress Notes (Signed)
Patient ID: Sarah BienenstockDanielle Hess, female   DOB: 1981/05/22, 36 y.o.   MRN: [id number] 29 wks G2P0101, IUGR, preeclampsia. Patient reports overall decreased FMs since 2 days. Felt some movements since being here.  No HA/ vision changes.  BP 126/78 (BP Location: Right Arm)   Pulse 74   Temp 98.1 F (36.7 C) (Oral)   Resp 18   Ht 5\' 10"  (1.778 m)   Wt 239 lb 8 oz (108.6 kg)   LMP 01/31/2017 (Approximate)   SpO2 98%   BMI 34.36 kg/m   Came to evaluate FHT.  160/ minimal variability/ intermittent variable decels with occasional late decel/ no accels. Category II No UCs  Labs pending.  MFM report reviewed, Free T4 added.   Start Magnesium for neuroprophylaxis, 4 gm bolus, then 2 gm/hr, strict I/O and mag checks in case emergent C/s needed if FHT in category III. Verbal consent for C-section discussed, patient agrees.  Left lateral position. 500 cc LR bolus.  NPO (ate lunch after admission) Inform NICU FHT not stable for transport  V.Juliene PinaMody, MD

## 2017-08-29 NOTE — Progress Notes (Signed)
Patient ID: Bradly BienenstockDanielle Hess, female   DOB: 1980/11/20, 36 y.o.   MRN: [id number] FHR 150-160 with minimal BTBV. Occ spontaneous deceleration and no accels Category 3 tracing Will proceed with csection OR aware Consent done.

## 2017-08-29 NOTE — Consult Note (Signed)
Neonatology Note:   Attendance at C-section:    I was asked by Dr. Billy Coastaavon to attend this C/S at 29 weeks due to worsening pre-eclampsia. The mother is a G2P0101, GBS unknown with good prenatal care complicated by oligohydramnios and hypothyroidism. S/p BTMZ early this week. ROM 0 hours before delivery, fluid clear. Infant somewhat vigorous with spontaneous cry and fair tone. +30 sec DCC.  After baby brought to warmer, needed minimal bulb suctioning to clear airway and then due to low HR ~60bpm, PPV started with good response and transition to cpap.  sao2 placed and in 80s at 1-2 min of life; fio2 titrated accordingly. Improved lung aeration equally and cpap for mild fio2 need at 5min.  Ap 4/8. Introduced to father and mother and accompanied by dad to NICU for continued management.   Sarah Kidavid C. Leary RocaEhrmann, MD

## 2017-08-30 ENCOUNTER — Encounter (HOSPITAL_COMMUNITY): Payer: Self-pay | Admitting: Obstetrics and Gynecology

## 2017-08-30 DIAGNOSIS — O1410 Severe pre-eclampsia, unspecified trimester: Secondary | ICD-10-CM | POA: Diagnosis present

## 2017-08-30 LAB — COMPREHENSIVE METABOLIC PANEL
ALT: 21 U/L (ref 14–54)
ANION GAP: 5 (ref 5–15)
AST: 24 U/L (ref 15–41)
Albumin: 2.3 g/dL — ABNORMAL LOW (ref 3.5–5.0)
Alkaline Phosphatase: 62 U/L (ref 38–126)
BILIRUBIN TOTAL: 0.4 mg/dL (ref 0.3–1.2)
BUN: 15 mg/dL (ref 6–20)
CHLORIDE: 105 mmol/L (ref 101–111)
CO2: 22 mmol/L (ref 22–32)
Calcium: 8.1 mg/dL — ABNORMAL LOW (ref 8.9–10.3)
Creatinine, Ser: 0.79 mg/dL (ref 0.44–1.00)
Glucose, Bld: 97 mg/dL (ref 65–99)
POTASSIUM: 4.7 mmol/L (ref 3.5–5.1)
Sodium: 132 mmol/L — ABNORMAL LOW (ref 135–145)
TOTAL PROTEIN: 5.1 g/dL — AB (ref 6.5–8.1)

## 2017-08-30 LAB — CBC
HEMATOCRIT: 24.5 % — AB (ref 36.0–46.0)
Hemoglobin: 8.1 g/dL — ABNORMAL LOW (ref 12.0–15.0)
MCH: 25.5 pg — ABNORMAL LOW (ref 26.0–34.0)
MCHC: 33.1 g/dL (ref 30.0–36.0)
MCV: 77 fL — AB (ref 78.0–100.0)
PLATELETS: 186 10*3/uL (ref 150–400)
RBC: 3.18 MIL/uL — ABNORMAL LOW (ref 3.87–5.11)
RDW: 14.2 % (ref 11.5–15.5)
WBC: 13.9 10*3/uL — ABNORMAL HIGH (ref 4.0–10.5)

## 2017-08-30 MED ORDER — FERROUS SULFATE 325 (65 FE) MG PO TABS
325.0000 mg | ORAL_TABLET | Freq: Every day | ORAL | Status: DC
  Administered 2017-08-30 – 2017-09-01 (×3): 325 mg via ORAL
  Filled 2017-08-30 (×3): qty 1

## 2017-08-30 MED ORDER — MAGNESIUM OXIDE 400 (241.3 MG) MG PO TABS
400.0000 mg | ORAL_TABLET | Freq: Two times a day (BID) | ORAL | Status: DC
  Administered 2017-08-30 – 2017-09-01 (×5): 400 mg via ORAL
  Filled 2017-08-30 (×7): qty 1

## 2017-08-30 MED ORDER — BUPIVACAINE IN DEXTROSE 0.75-8.25 % IT SOLN
INTRATHECAL | Status: DC | PRN
  Administered 2017-08-29: 1.6 mL via INTRATHECAL

## 2017-08-30 NOTE — Addendum Note (Signed)
Addendum  created 08/30/17 0803 by Angela AdamWrinkle, Gaylen Venning G, CRNA   Sign clinical note

## 2017-08-30 NOTE — Progress Notes (Signed)
POD #1  Pt notes minimal lochia, pain well controlled. tol reg po last night, no N/V. No HA, no vision change, no RUQ pain, + flatus, foley just out, no void yet. Ambulated w/o dizziness.  O: Vitals:   08/29/17 1830 08/29/17 1850 08/29/17 2041 08/30/17 0635  BP: 135/85 (!) 141/80 127/72 (!) 106/59  Pulse: (!) 54 60 (!) 54 (!) 57  Resp: 13 18 15    Temp:  98.5 F (36.9 C) 98.8 F (37.1 C) 98.5 F (36.9 C)  TempSrc:  Oral Oral Oral  SpO2: 98%  98% 98%  Weight:      Height:       Gen: well appearing, no distress CV: RRR Pulm CTAB  Abd: obese, appropriately tender, ND, active BS Inc: dressed no staining LE: NT, no edema  CBC  CBC Latest Ref Rng & Units 08/30/2017 08/29/2017 05/26/2014  WBC 4.0 - 10.5 K/uL 13.9(H) 14.6(H) 12.3(H)  Hemoglobin 12.0 - 15.0 g/dL 8.1(L) 10.0(L) 12.7  Hematocrit 36.0 - 46.0 % 24.5(L) 29.8(L) 38.3  Platelets 150 - 400 K/uL 186 203 217   CMP Latest Ref Rng & Units 08/29/2017 05/26/2014 05/11/2014  Glucose 65 - 99 mg/dL 829(F119(H) 91 621(H108(H)  BUN 6 - 20 mg/dL 14 6(L) 14  Creatinine 0.44 - 1.00 mg/dL 0.860.72 5.780.76 4.690.86  Sodium 135 - 145 mmol/L 136 137 140  Potassium 3.5 - 5.1 mmol/L 4.1 3.9 4.1  Chloride 101 - 111 mmol/L 106 106 108(H)  CO2 22 - 32 mmol/L 20(L) 22 26  Calcium 8.9 - 10.3 mg/dL 6.2(X8.8(L) 5.2(W8.0(L) 8.6  Total Protein 6.5 - 8.1 g/dL 6.4(L) - -  Total Bilirubin 0.3 - 1.2 mg/dL 4.1(L0.2(L) - -  Alkaline Phos 38 - 126 U/L 81 - -  AST 15 - 41 U/L 32 - -  ALT 14 - 54 U/L 26 - -    A/P: POD #1 s/p RCS due [facility] with severe features and non-reasurring fetal testing Post-op. Pt doing well, cont pain management, advance diet PEC, nl bps, nl labs, not on Mag, no si/sx PEC Anemia. Will start iron  Ezzard Ditmer A. 08/30/2017 8:08 AM

## 2017-08-30 NOTE — Anesthesia Postprocedure Evaluation (Signed)
Anesthesia Post Note  Patient: Bradly BienenstockDanielle Suliman  Procedure(s) Performed: CESAREAN SECTION (N/A )     Patient location during evaluation: PACU Anesthesia Type: Spinal Level of consciousness: oriented and awake and alert Pain management: pain level controlled Vital Signs Assessment: post-procedure vital signs reviewed and stable Respiratory status: spontaneous breathing, respiratory function stable and patient connected to nasal cannula oxygen Cardiovascular status: blood pressure returned to baseline and stable Postop Assessment: no headache, no backache, no apparent nausea or vomiting and spinal receding Anesthetic complications: no    Last Vitals:  Vitals:   08/29/17 2041 08/30/17 0635  BP: 127/72 (!) 106/59  Pulse: (!) 54 (!) 57  Resp: 15   Temp: 37.1 C 36.9 C  SpO2: 98% 98%    Last Pain:  Vitals:   08/30/17 0635  TempSrc: Oral  PainSc:                  Cecile HearingStephen Edward Turk

## 2017-08-30 NOTE — Anesthesia Postprocedure Evaluation (Signed)
Anesthesia Post Note  Patient: Bradly BienenstockDanielle Regino  Procedure(s) Performed: CESAREAN SECTION (N/A )     Patient location during evaluation: [facility] Anesthesia Type: Spinal Level of consciousness: awake and alert Pain management: satisfactory to patient Vital Signs Assessment: post-procedure vital signs reviewed and stable Respiratory status: spontaneous breathing Cardiovascular status: stable Postop Assessment: no headache, spinal receding, patient able to bend at knees and no apparent nausea or vomiting Anesthetic complications: no Comments: Pain score 3.    Last Vitals:  Vitals:   08/29/17 2041 08/30/17 0635  BP: 127/72 (!) 106/59  Pulse: (!) 54 (!) 57  Resp: 15   Temp: 37.1 C 36.9 C  SpO2: 98% 98%    Last Pain:  Vitals:   08/30/17 0635  TempSrc: Oral  PainSc:    Pain Goal:                 [facility] Center[facility]INKLE,Anneke Cundy

## 2017-08-30 NOTE — Lactation Note (Signed)
This note was copied from a baby's chart. Lactation Consultation Note  Patient Name: Sarah Hess ZOXWR'UToday's Date: 08/30/2017 Reason for consult: Initial assessment;NICU baby;Preterm <34wks;Other (Comment);Breast reduction (Lactation induction / breast reduction 18 years ago , high B/P , post Magso4 , B/P meds - Labetalol )  Baby is 29 0/7 weeks .  DEBP already set up. And per mom started pumping in the recovery room.  LC reviewed hand expressing by talking mom through steps, and mom was able  To hand express without hand on by LC . Small drop from the right , and none from the left.  LC reassured mom it can be a slow process , but beneficial prior to pumping and afterwards,  LC reviewed the setting on the DEBP and checked the flanges, the #24 F is a good fit and mom  Comfortable. Mom has labels , and yellow stickers for the 1st - 24 .  Per mom has to call  insurance company about her DEBP. LC [facility] / also due to the reduction.  Mother informed of post-discharge support and given phone number to the lactation department,  including services for phone call assistance; out-patient appointments; and breastfeeding support group.  List of other breastfeeding resources in the community given in the handout. Encouraged mother to call for  problems or concerns related to breastfeeding.   Maternal Data Has patient been taught Hand Expression?: Yes (right breast, small drop , left breast none ) Does the patient have breastfeeding experience prior to this delivery?: Yes  Feeding    LATCH Score                   Interventions Interventions: Breast feeding basics reviewed;Expressed milk  Lactation Tools Discussed/Used Tools: Pump Breast pump type: Double-Electric Breast Pump WIC Program: No Pump Review: Setup, frequency, and cleaning;Milk Storage Initiated by:: - LC reviewed DEBP already set up / MAI  Date initiated::  08/30/17   Consult Status Consult Status: Follow-up Date: 08/31/17 Follow-up type: In-patient    Sarah Hess 08/30/2017, 1:14 PM

## 2017-08-31 DIAGNOSIS — O9902 Anemia complicating childbirth: Secondary | ICD-10-CM | POA: Diagnosis present

## 2017-08-31 NOTE — Progress Notes (Signed)
Subjective: POD# 2 s/p RCS 29 wks due to [facility] with severe features and NRFHT Information for the patient's newborn:  Alis, Sawchuk [[id number]]  female   Baby name: Ashok Croon with mom in NICU at baby's bedside  Reports feeling better today, some soreness present. Feeding: breast - pumping for now. Patient reports tolerating PO.  Breast symptoms:small amount colostrum on R side (hx redux) Pain controlled with PO meds Denies HA/SOB/C/P/N/V/dizziness. Flatus present. She reports vaginal bleeding as normal, without clots.  She is ambulating, urinating without difficulty.     Objective:   VS:    Vitals:   08/30/17 2138 08/30/17 2140 08/31/17 0616 08/31/17 0748  BP: 110/63 110/63  122/70  Pulse:  (!) 57  67  Resp:   19 19  Temp:   98.1 F (36.7 C) 98.2 F (36.8 C)  TempSrc:   Oral Oral  SpO2:   95% 98%  Weight:      Height:         Intake/Output Summary (Last 24 hours) at 08/31/17 0915 Last data filed at 08/31/17 6568  Gross per 24 hour  Intake              480 ml  Output             4500 ml  Net            -4020 ml        Recent Labs  08/29/17 1349 08/30/17 0543  WBC 14.6* 13.9*  HGB 10.0* 8.1*  HCT 29.8* 24.5*  PLT 203 186   Hepatic Function Latest Ref Rng & Units 08/30/2017 08/29/2017 05/26/2014  Total Protein 6.5 - 8.1 g/dL 5.1(L) 6.4(L) -  Albumin 3.5 - 5.0 g/dL 2.3(L) 2.8(L) 3.1(L)  AST 15 - 41 U/L 24 32 -  ALT 14 - 54 U/L 21 26 -  Alk Phosphatase 38 - 126 U/L 62 81 -  Total Bilirubin 0.3 - 1.2 mg/dL 0.4 0.2(L) -     Blood type: --/--/B POS (11/01 1349)  Rubella:       Physical Exam:  General: alert, cooperative and no distress Abdomen: soft, nontender, normal bowel sounds Incision: clean, dry, intact and pressure dressing in place, will remove once mom back in her room Uterine Fundus: firm, below umbilicus, nontender Lochia: minimal Ext: no edema, redness or tenderness in the calves or thighs   Assessment/Plan: 36 y.o.   POD# 2.  [id number]                  Principal Problem:   Postpartum care following cesarean delivery (11/1) Active Problems:   Cesarean delivery delivered: indication: NRFHR, pre-eclampsia, footling breech   Severe preeclampsia   Maternal anemia, with delivery   Doing well, stable.    PEC resolving - normotensive and labs stable, good diuresis   Oral Fe supplement        Routine post-op care  Juliene Pina, CNM, MSN 08/31/2017, 9:15 AM

## 2017-08-31 NOTE — Progress Notes (Signed)
   08/31/17 1500  Vital Signs  BP (!) 116/56  BP Location Left Arm  Patient Position (if appropriate) Semi-fowlers  Pulse Rate 60  Pulse Rate Source Monitor  Labetolol held; Dr. Billy Coastaavon paged; awaiting return call from MD.

## 2017-09-01 MED ORDER — SIMETHICONE 80 MG PO CHEW: 80.0000 mg | CHEWABLE_TABLET | Freq: Three times a day (TID) | ORAL | 0 refills | Status: DC

## 2017-09-01 MED ORDER — ACETAMINOPHEN 325 MG PO TABS: 650.0000 mg | ORAL_TABLET | ORAL | Status: DC | PRN

## 2017-09-01 MED ORDER — COCONUT OIL OIL: 1.0000 "application " | TOPICAL_OIL | 0 refills | Status: DC | PRN

## 2017-09-01 MED ORDER — FERROUS SULFATE 325 (65 FE) MG PO TABS: 325.0000 mg | ORAL_TABLET | Freq: Every day | ORAL | 3 refills | Status: AC

## 2017-09-01 MED ORDER — OXYCODONE-ACETAMINOPHEN 5-325 MG PO TABS: 1.0000 | ORAL_TABLET | ORAL | 0 refills | Status: DC | PRN

## 2017-09-01 MED ORDER — MAGNESIUM OXIDE 400 (241.3 MG) MG PO TABS: 400.0000 mg | ORAL_TABLET | Freq: Two times a day (BID) | ORAL | Status: DC

## 2017-09-01 MED ORDER — SENNOSIDES-DOCUSATE SODIUM 8.6-50 MG PO TABS: 2.0000 | ORAL_TABLET | ORAL | Status: DC

## 2017-09-01 NOTE — Lactation Note (Addendum)
This note was copied from a baby's chart. Lactation Consultation Note  Patient Name: Sarah Hess VSYVG'C Date: 09/01/2017   NICU baby 83 hours old. Called to mom's room for DEBP prior to D/C. Discussed with mom that she will need to obtain rental pump from [place] until 1900.   Enc mom to take pumping kit with her to use with rental pump and with pumps in pumping rooms in NICU.   Maternal Data    Feeding Feeding Type: Donor Breast Milk Length of feed: 30 min  LATCH Score                   Interventions    Lactation Tools Discussed/Used     Consult Status      Andres Labrum 09/01/2017, 3:49 PM

## 2017-09-01 NOTE — Progress Notes (Signed)
Discharged home ambulatory in stable condition.

## 2017-09-01 NOTE — Progress Notes (Addendum)
   09/01/17 0654  Vital Signs  BP (!) 93/51  BP Location Left Arm  Patient Position (if appropriate) Semi-fowlers  BP Method Automatic  Pulse Rate 76  Pulse Rate Source Monitor  Temp 98 F (36.7 C)  Temp Source Oral  Oxygen Therapy  SpO2 100 %  O2 Device Room Air  Labetalol 100 mg held at 0600 hrs this AM d/t low BP. Pressure dressing removed as ordered and abdominal binder applied. Pt removed same because she had some discomfort with it. Pt stated that she will resume same this AM.

## 2017-09-01 NOTE — Progress Notes (Deleted)
Discharge instructions given and patient verbalized understanding of all instructions given. Rx's given to patient along with written copy of AVS.

## 2017-09-01 NOTE — Progress Notes (Signed)
Discharge instructions given to patient and she verbalized understanding of all instructions given. Written copy of AVS given to patient along with written Rx's.

## 2017-09-01 NOTE — Progress Notes (Signed)
Subjective: POD# 3 Information for the patient's newborn:  Sarah Hess, Sarah Hess [[id number]][[id number]]  female   Baby name: Sarah Hess  Reports feeling sore, has been taking NSAID sparingly (hx gastric bypass), refraining from Percocet, makes her too sleepy. Feeding: breast - pumping Patient reports tolerating PO.  Breast symptoms: + colostrum Denies HA/SOB/Hess/P/N/V/dizziness. Flatus present. She reports vaginal bleeding as normal, without clots.  She is ambulating, urinating without difficulty.     Objective:   VS:    Vitals:   08/31/17 2315 09/01/17 0014 09/01/17 0654 09/01/17 0849  BP: 138/85 (!) 101/55 (!) 93/51 115/63  Pulse: (!) 103 69 76 64  Resp:    18  Temp: 98.5 F (36.9 Hess) 98.2 F (36.8 Hess) 98 F (36.7 Hess) 98.5 F (36.9 Hess)  TempSrc: Oral Oral Oral Oral  SpO2: 100% 98% 100% 98%  Weight:      Height:          Intake/Output Summary (Last 24 hours) at 09/01/2017 0935 Last data filed at 09/01/2017 0000 Gross per 24 hour  Intake 500 ml  Output 700 ml  Net -200 ml        Recent Labs    08/29/17 1349 08/30/17 0543  WBC 14.6* 13.9*  HGB 10.0* 8.1*  HCT 29.8* 24.5*  PLT 203 186     Blood type: --/--/B POS (11/01 1349)  Rubella:   Immune    Physical Exam:  General: alert, cooperative and no distress Abdomen: soft, nontender, normal bowel sounds Incision: clean, dry, intact and .HB applied Uterine Fundus: firm, below umbilicus, nontender Lochia: minimal Ext: + 1 pedal edema, no redness or tenderness in the calves or thighs      Assessment/Plan: 36 y.o.   POD# 3. [id number]                  Principal Problem:   Postpartum care following cesarean delivery (11/1) Active Problems:   Cesarean delivery delivered: indication: NRFHR, pre-eclampsia, footling breech   Severe preeclampsia   Maternal anemia, with delivery   Doing well, stable.    DC home today.  Normotensive, labetalol held this AM d/t low BP, no neural s/s Will DC labetalol for now Pain control  suboptimal, advised APAP, can take 1/2 - 1 tab Roxycet q 4 hrs as needed Oral Fe and mag ox supplement for anemia, can add alfalfa 2000 mg daily, increase to full dose over course of 1 week F/U in office 1 week for BP check  Plan in consult with Dr. Alvin Critchleyaavon  Sarah Hess Analyce Hess, CNM, MSN 09/01/2017, 9:35 AM

## 2017-09-01 NOTE — Discharge Summary (Signed)
OB Discharge Summary     Patient Name: Sarah Hess DOB: 01-Jul-1981 MRN: [id number]  Date of admission: 08/29/2017 Delivering MD: Olivia MackieAAVON, RICHARD   Date of discharge: 09/01/2017  Admitting diagnosis: 29WKS, ELEVATED BP Intrauterine pregnancy: 579w0d     Secondary diagnosis:  Principal Problem:   Postpartum care following cesarean delivery (11/1) Active Problems:   Cesarean delivery delivered: indication: NRFHR, pre-eclampsia, footling breech   Severe preeclampsia   Maternal anemia, with delivery      Discharge diagnosis: Term Pregnancy Delivered and Anemia                                   Complications: None  Hospital course:  Sceduled C/S   36 y.o. yo G1P0101 at 1279w0d was admitted to the hospital 08/29/2017 for cesarean section with the following indication:Prior Uterine Surgery, severe preeclampsia, fetal indication - non-reassuring fetal testing, breech presentation.  Membrane Rupture Time/Date: 4:34 PM ,08/29/2017   Patient delivered a Viable infant.08/29/2017  Details of operation can be found in separate operative note.  Pateint had an uncomplicated postpartum course.  She is ambulating, tolerating a regular diet, passing flatus, and urinating well. Patient is discharged home in stable condition on  09/01/17         Physical exam  Vitals:   08/31/17 2315 09/01/17 0014 09/01/17 0654 09/01/17 0849  BP: 138/85 (!) 101/55 (!) 93/51 115/63  Pulse: (!) 103 69 76 64  Resp:    18  Temp: 98.5 F (36.9 C) 98.2 F (36.8 C) 98 F (36.7 C) 98.5 F (36.9 C)  TempSrc: Oral Oral Oral Oral  SpO2: 100% 98% 100% 98%  Weight:      Height:       See progress note  Labs: Lab Results  Component Value Date   WBC 13.9 (H) 08/30/2017   HGB 8.1 (L) 08/30/2017   HCT 24.5 (L) 08/30/2017   MCV 77.0 (L) 08/30/2017   PLT 186 08/30/2017   CMP Latest Ref Rng & Units 08/30/2017  Glucose 65 - 99 mg/dL 97  BUN 6 - 20 mg/dL 15  Creatinine 4.090.44 - 8.111.00 mg/dL 9.140.79  Sodium 782135 - 956145 mmol/L  132(L)  Potassium 3.5 - 5.1 mmol/L 4.7  Chloride 101 - 111 mmol/L 105  CO2 22 - 32 mmol/L 22  Calcium 8.9 - 10.3 mg/dL 8.1(L)  Total Protein 6.5 - 8.1 g/dL 5.1(L)  Total Bilirubin 0.3 - 1.2 mg/dL 0.4  Alkaline Phos 38 - 126 U/L 62  AST 15 - 41 U/L 24  ALT 14 - 54 U/L 21    Discharge instruction: per After Visit Summary and [facility] booklet   After visit meds:  Allergies as of 09/01/2017      Reactions   Pineapple Anaphylaxis      Medication List    STOP taking these medications   labetalol 100 MG tablet Commonly known as:  NORMODYNE     TAKE these medications   acetaminophen 325 MG tablet Commonly known as:  TYLENOL Take 2 tablets (650 mg total) every 4 (four) hours as needed by mouth (for pain scale < 4).   aspirin 81 MG chewable tablet Chew 81 mg by mouth daily.   coconut oil Oil Apply 1 application as needed topically.   ferrous sulfate 325 (65 FE) MG tablet Take 1 tablet (325 mg total) daily with breakfast by mouth. Start taking on:  09/02/2017   levothyroxine 175  MCG tablet Commonly known as:  SYNTHROID, LEVOTHROID Take 175 mcg by mouth daily before breakfast.   magnesium oxide 400 (241.3 Mg) MG tablet Commonly known as:  MAG-OX Take 1 tablet (400 mg total) 2 (two) times daily by mouth.   oxyCODONE-acetaminophen 5-325 MG tablet Commonly known as:  PERCOCET/ROXICET Take 1 tablet every 4 (four) hours as needed by mouth (pain scale 4-7).   prenatal multivitamin Tabs tablet Take 1 tablet by mouth daily at 12 noon.   senna-docusate 8.6-50 MG tablet Commonly known as:  Senokot-S Take 2 tablets daily by mouth. Start taking on:  09/02/2017   simethicone 80 MG chewable tablet Commonly known as:  MYLICON Chew 1 tablet (80 mg total) 3 (three) times daily after meals by mouth.       Diet: routine diet  Activity: Advance as tolerated. Pelvic rest for 6 weeks.   Outpatient follow up: 1 week in office BP check  Postpartum contraception: Not  Discussed  Newborn Data: Live born female Sarah Hess Birth Weight: 1 lb 13.3 oz (830 g) APGAR: 4, 8  Newborn Delivery   Birth date/time:  08/29/2017 16:34:00 Delivery type:  C-Section, Low Transverse C-section categorization:  Repeat     Baby Feeding: donor milk Disposition:NICU   09/01/2017 Neta Mends, CNM

## 2017-09-02 LAB — TYPE AND SCREEN
ABO/RH(D): B POS
Antibody Screen: POSITIVE
Donor AG Type: NEGATIVE
Donor AG Type: NEGATIVE
PT AG TYPE: NEGATIVE
UNIT DIVISION: 0
Unit division: 0

## 2017-09-02 LAB — BPAM RBC
Blood Product Expiration Date: 201811142359
Blood Product Expiration Date: 201811212359
UNIT TYPE AND RH: 7300
Unit Type and Rh: 5100

## 2019-06-05 DIAGNOSIS — D509 Iron deficiency anemia, unspecified: Secondary | ICD-10-CM | POA: Diagnosis not present

## 2024-04-20 ENCOUNTER — Inpatient Hospital Stay: Attending: Hematology and Oncology | Admitting: Hematology and Oncology

## 2024-04-20 ENCOUNTER — Inpatient Hospital Stay

## 2024-04-20 ENCOUNTER — Other Ambulatory Visit: Payer: Self-pay | Admitting: Hematology and Oncology

## 2024-04-20 ENCOUNTER — Telehealth: Payer: Self-pay | Admitting: Hematology and Oncology

## 2024-04-20 ENCOUNTER — Other Ambulatory Visit: Payer: Self-pay

## 2024-04-20 ENCOUNTER — Encounter: Payer: Self-pay | Admitting: Hematology and Oncology

## 2024-04-20 VITALS — BP 116/50 | HR 70 | Temp 98.7°F | Resp 14 | Ht 70.9 in | Wt 253.3 lb

## 2024-04-20 DIAGNOSIS — D649 Anemia, unspecified: Secondary | ICD-10-CM

## 2024-04-20 DIAGNOSIS — D509 Iron deficiency anemia, unspecified: Secondary | ICD-10-CM | POA: Diagnosis not present

## 2024-04-20 DIAGNOSIS — E039 Hypothyroidism, unspecified: Secondary | ICD-10-CM | POA: Insufficient documentation

## 2024-04-20 DIAGNOSIS — Z79899 Other long term (current) drug therapy: Secondary | ICD-10-CM | POA: Diagnosis not present

## 2024-04-20 LAB — CMP (CANCER CENTER ONLY)
ALT: 21 U/L (ref 0–44)
AST: 25 U/L (ref 15–41)
Albumin: 4.5 g/dL (ref 3.5–5.0)
Alkaline Phosphatase: 76 U/L (ref 38–126)
Anion gap: 10 (ref 5–15)
BUN: 8 mg/dL (ref 6–20)
CO2: 23 mmol/L (ref 22–32)
Calcium: 9.7 mg/dL (ref 8.9–10.3)
Chloride: 106 mmol/L (ref 98–111)
Creatinine: 0.77 mg/dL (ref 0.44–1.00)
GFR, Estimated: >60 mL/min (ref >60)
Glucose, Bld: 86 mg/dL (ref 70–99)
Potassium: 4.3 mmol/L (ref 3.5–5.1)
Sodium: 139 mmol/L (ref 135–145)
Total Bilirubin: 0.8 mg/dL (ref 0.0–1.2)
Total Protein: 7.4 g/dL (ref 6.5–8.1)

## 2024-04-20 LAB — FOLATE: Folate: >40 ng/mL (ref >5.9)

## 2024-04-20 LAB — FERRITIN: Ferritin: 4 ng/mL — ABNORMAL LOW (ref 11–307)

## 2024-04-20 LAB — CBC WITH DIFFERENTIAL (CANCER CENTER ONLY)
Abs Immature Granulocytes: 0.02 10*3/uL (ref 0.00–0.07)
Basophils Absolute: 0 10*3/uL (ref 0.0–0.1)
Basophils Relative: 0 %
Eosinophils Absolute: 0.1 10*3/uL (ref 0.0–0.5)
Eosinophils Relative: 1 %
HCT: 39 % (ref 36.0–46.0)
Hemoglobin: 12.6 g/dL (ref 12.0–15.0)
Immature Granulocytes: 0 %
Lymphocytes Relative: 26 %
Lymphs Abs: 1.9 10*3/uL (ref 0.7–4.0)
MCH: 26 pg (ref 26.0–34.0)
MCHC: 32.3 g/dL (ref 30.0–36.0)
MCV: 80.6 fL (ref 80.0–100.0)
Monocytes Absolute: 0.6 10*3/uL (ref 0.1–1.0)
Monocytes Relative: 9 %
Neutro Abs: 4.5 10*3/uL (ref 1.7–7.7)
Neutrophils Relative %: 64 %
Platelet Count: 277 10*3/uL (ref 150–400)
RBC: 4.84 MIL/uL (ref 3.87–5.11)
RDW: 13.6 % (ref 11.5–15.5)
WBC Count: 7.1 10*3/uL (ref 4.0–10.5)
nRBC: 0 % (ref 0.0–0.2)

## 2024-04-20 LAB — IRON AND TIBC
Iron: 59 ug/dL (ref 28–170)
Saturation Ratios: 11 % (ref 10.4–31.8)
TIBC: 547 ug/dL — ABNORMAL HIGH (ref 250–450)
UIBC: 488 ug/dL

## 2024-04-20 LAB — TSH: TSH: 5.8 u[IU]/mL — ABNORMAL HIGH (ref 0.350–4.500)

## 2024-04-20 LAB — VITAMIN B12: Vitamin B-12: 133 pg/mL — ABNORMAL LOW (ref 180–914)

## 2024-04-20 LAB — LACTATE DEHYDROGENASE: LDH: 139 U/L (ref 98–192)

## 2024-04-20 NOTE — Progress Notes (Signed)
 [facility] [address] [place],  [place]  [address] [phone]  Clinic Day:  04/20/2024   Referring physician: Thurmond Cathlyn LABOR., MD  Patient Care Team: Patient Care Team: Thurmond Cathlyn LABOR., MD as PCP - General (Internal Medicine)   REASON FOR CONSULTATION:  Iron deficiency anemia  HISTORY OF PRESENT ILLNESS:   Sarah Hess is a 43 y.o. female with a history of iron deficiency anemia who is referred in consultation by Cathlyn Thurmond, MD for assessment and management. She has a long history of iron deficiency anemia and has received iron infusions in the past. She has been on oral replacement recently that has been ineffective. She reports increasing fatigue without shortness of breath or chest pain. She denies fever, chills, nausea or vomiting. She denies issue with bowel or bladder. She denies changes in her appetite or weight. Medical history is significant for ADHD, iron deficiency anemia, mixed hyperlipidemia, prediabetes, osteoarthritis, hypothyroidism. Surgical history includes mammoplasty reduction. She is being evaluated for hernia repair. Family history is significant for coronary artery disease, HTN, and stroke. She is a never smoker and drinks a couple of glasses of wine a month. She also takes recreational edibles.    REVIEW OF SYSTEMS:   Review of Systems  Constitutional:  Positive for fatigue.  HENT:  Negative.    Eyes: Negative.   Respiratory: Negative.    Cardiovascular: Negative.   Gastrointestinal: Negative.   Endocrine: Negative.   Genitourinary: Negative.    Musculoskeletal: Negative.   Skin: Negative.   Neurological: Negative.   Hematological: Negative.   Psychiatric/Behavioral: Negative.       VITALS:   Blood pressure (!) 116/50, pulse 70, temperature 98.7 F (37.1 C), temperature source Oral, resp. rate 14, height 5' 10.9 (1.801 m), weight 253 lb 4.8 oz (114.9 kg), SpO2 100%, unknown if currently breastfeeding.  Wt Readings from  Last 3 Encounters:  04/20/24 253 lb 4.8 oz (114.9 kg)  08/29/17 239 lb 8 oz (108.6 kg)    Body mass index is 35.43 kg/m.  Performance status (ECOG): 1 - Symptomatic but completely ambulatory  PHYSICAL EXAM:   Physical Exam Constitutional:      Appearance: Normal appearance. She is normal weight.  HENT:     Head: Normocephalic and atraumatic.     Mouth/Throat:     Mouth: Mucous membranes are moist.   Cardiovascular:     Rate and Rhythm: Normal rate and regular rhythm.     Pulses: Normal pulses.     Heart sounds: Normal heart sounds.  Pulmonary:     Effort: Pulmonary effort is normal.     Breath sounds: Normal breath sounds.  Abdominal:     Palpations: Abdomen is soft.   Musculoskeletal:        General: Normal range of motion.     Cervical back: Normal range of motion.   Skin:    General: Skin is warm and dry.   Neurological:     General: No focal deficit present.     Mental Status: She is alert and oriented to person, place, and time. Mental status is at baseline.   Psychiatric:        Mood and Affect: Mood normal.        Behavior: Behavior normal.        Thought Content: Thought content normal.        Judgment: Judgment normal.      LABS:      Latest Ref Rng & Units 04/20/2024  10:14 AM 08/30/2017    5:43 AM 08/29/2017    1:49 PM  CBC  WBC 4.0 - 10.5 K/uL 7.1  13.9  14.6   Hemoglobin 12.0 - 15.0 g/dL 87.3  8.1  89.9   Hematocrit 36.0 - 46.0 % 39.0  24.5  29.8   Platelets 150 - 400 K/uL 277  186  203       Latest Ref Rng & Units 04/20/2024   10:14 AM 08/30/2017    7:45 AM 08/29/2017    1:49 PM  CMP  Glucose 70 - 99 mg/dL 86  97  880   BUN 6 - 20 mg/dL 8  15  14    Creatinine 0.44 - 1.00 mg/dL 9.22  9.20  9.27   Sodium 135 - 145 mmol/L 139  132  136   Potassium 3.5 - 5.1 mmol/L 4.3  4.7  4.1   Chloride 98 - 111 mmol/L 106  105  106   CO2 22 - 32 mmol/L 23  22  20    Calcium  8.9 - 10.3 mg/dL 9.7  8.1  8.8   Total Protein 6.5 - 8.1 g/dL 7.4  5.1  6.4    Total Bilirubin 0.0 - 1.2 mg/dL 0.8  0.4  0.2   Alkaline Phos 38 - 126 U/L 76  62  81   AST 15 - 41 U/L 25  24  32   ALT 0 - 44 U/L 21  21  26       No results found for: CEA1, CEA / No results found for: CEA1, CEA No results found for: PSA1 No results found for: CAN199 No results found for: CAN125  No results found for: TOTALPROTELP, ALBUMINELP, A1GS, A2GS, BETS, BETA2SER, GAMS, MSPIKE, SPEI No results found for: TIBC, FERRITIN, IRONPCTSAT No results found for: LDH  STUDIES:   No results found.    HISTORY:   No past medical history on file.    Family History  Problem Relation Age of Onset   Breast cancer Mother    Uterine cancer Mother    Heart attack Father    Diabetes Sister    Breast cancer Sister    Breast cancer Sister    Cancer Paternal Uncle        Gliobastoma   Heart disease Maternal Grandmother        CABG   Prostate cancer Maternal Grandfather    Colon cancer Maternal Grandfather    Heart disease Maternal Grandfather        CABG   Heart disease Paternal Grandmother        CABG   Ovarian cancer Paternal Grandmother    Dementia Paternal Grandmother    Alzheimer's disease Paternal Grandmother    Heart attack Paternal Grandfather     Social History:  reports that she has quit smoking. Her smoking use included cigarettes. She has never used smokeless tobacco. She reports current alcohol use. She reports that she does not currently use drugs.The patient is alone today.  Allergies:  Allergies  Allergen Reactions   Pineapple Anaphylaxis    Only while pregnant, able to eat it now.    Current Medications: Current Outpatient Medications  Medication Sig Dispense Refill   atorvastatin (LIPITOR) 20 MG tablet Take 20 mg by mouth daily.     Calcium  Carb-Cholecalciferol (OYSTER SHELL CALCIUM  W/D) 500-5 MG-MCG TABS Take 1 tablet by mouth daily.     Cyanocobalamin (PHYSICIANS EZ USE B-12) 1000 MCG/ML KIT Inject 1 kit  into the muscle every  30 (thirty) days.     ferrous sulfate  325 (65 FE) MG tablet Take 1 tablet (325 mg total) daily with breakfast by mouth.  3   levothyroxine  (SYNTHROID , LEVOTHROID) 175 MCG tablet Take 175 mcg by mouth daily before breakfast.     Prenatal Vit-Fe Fumarate-FA (PRENATAL MULTIVITAMIN) TABS tablet Take 1 tablet by mouth daily at 12 noon.     No current facility-administered medications for this visit.     ASSESSMENT & PLAN:   Assessment:  Sarah Hess is a 43 y.o. female with history of iron deficiency anemia. She has been using oral iron supplement without success. She has had IV iron in the past and is in agreement to this treatment plan again. She is preparing for hernia repair surgery and needs to have her iron levels improved. CBC and CMP are unremarkable today. Depending on iron studies obtained today, we will plan for IV iron.   Plan: 1.  Venofer. Return to clinic 4 weeks post infusion.   I discussed the assessment and treatment plan with the patient.  The patient was provided an opportunity to ask questions and all were answered.  The patient agreed with the plan and demonstrated an understanding of the instructions.    Thank you for the referral    45 minutes was spent in patient care.  This included time spent preparing to see the patient (e.g., review of tests), obtaining and/or reviewing separately obtained history, counseling and educating the patient/family/caregiver, ordering medications, tests, or procedures; documenting clinical information in the electronic or other health record, independently interpreting results and communicating results to the patient/family/caregiver as well as coordination of care.      Eleanor DELENA Bach, NP   Family Nurse Practitioner - Board Certified [facility] [place] [phone]

## 2024-04-20 NOTE — Telephone Encounter (Signed)
.  Patient has been scheduled for follow-up visit per 04/20/24 LOS.  Pt noted appt details on personal electronic device.

## 2024-04-21 ENCOUNTER — Inpatient Hospital Stay

## 2024-04-21 VITALS — BP 115/54 | HR 64 | Temp 98.8°F | Resp 20

## 2024-04-21 DIAGNOSIS — D509 Iron deficiency anemia, unspecified: Secondary | ICD-10-CM | POA: Diagnosis not present

## 2024-04-21 DIAGNOSIS — D649 Anemia, unspecified: Secondary | ICD-10-CM

## 2024-04-21 LAB — HAPTOGLOBIN: Haptoglobin: 132 mg/dL (ref 42–296)

## 2024-04-21 MED ORDER — IRON SUCROSE 20 MG/ML IV SOLN
200.0000 mg | Freq: Once | INTRAVENOUS | Status: AC
  Administered 2024-04-21: 200 mg via INTRAVENOUS
  Filled 2024-04-21: qty 10

## 2024-04-21 MED ORDER — SODIUM CHLORIDE 0.9% FLUSH
10.0000 mL | Freq: Once | INTRAVENOUS | Status: AC | PRN
  Administered 2024-04-21: 10 mL

## 2024-04-21 MED ORDER — ACETAMINOPHEN 325 MG PO TABS
650.0000 mg | ORAL_TABLET | Freq: Once | ORAL | Status: AC
  Administered 2024-04-21: 650 mg via ORAL
  Filled 2024-04-21: qty 2

## 2024-04-21 MED ORDER — LORATADINE 10 MG PO TABS
10.0000 mg | ORAL_TABLET | Freq: Once | ORAL | Status: AC
  Administered 2024-04-21: 10 mg via ORAL
  Filled 2024-04-21: qty 1

## 2024-04-21 NOTE — Patient Instructions (Signed)
 Iron -Rich Diet  Iron  is a mineral that helps your body produce hemoglobin. Hemoglobin is a protein in red blood cells that carries oxygen to your body's tissues. Eating too little iron  may cause you to feel weak and tired, and it can increase your risk of infection. Iron  is naturally found in many foods, and many foods have iron  added to them (are iron -fortified). You may need to follow an iron -rich diet if you do not have enough iron  in your body due to certain medical conditions. The amount of iron  that you need each day depends on your age, your sex, and any medical conditions you have. Follow instructions from your health care provider or a dietitian about how much iron  you should eat each day. What are tips for following this plan? Reading food labels Check food labels to see how many milligrams (mg) of iron  are in each serving. Cooking Cook foods in pots and pans that are made from iron . Take these steps to make it easier for your body to absorb iron  from certain foods: Soak beans overnight before cooking. Soak whole grains overnight and drain them before using. Ferment flours before baking, such as by using yeast in bread dough. Meal planning When you eat foods that contain iron , you should eat them with foods that are high in vitamin C. These include oranges, peppers, tomatoes, potatoes, and mangoes. Vitamin C helps your body absorb iron . Certain foods and drinks prevent your body from absorbing iron  properly. Avoid eating these foods in the same meal as iron -rich foods or with iron  supplements. These foods include: Coffee, black tea, and red wine. Milk, dairy products, and foods that are high in calcium. Beans and soybeans. Whole grains. General information Take iron  supplements only as told by your health care provider. An overdose of iron  can be life-threatening. If you were prescribed iron  supplements, take them with orange juice or a vitamin C supplement. When you eat  iron -fortified foods or take an iron  supplement, you should also eat foods that naturally contain iron , such as meat, poultry, and fish. Eating naturally iron -rich foods helps your body absorb the iron  that is added to other foods or contained in a supplement. Iron  from animal sources is better absorbed than iron  from plant sources. What foods should I eat? Vegetables Spinach (cooked). Green peas. Broccoli. Fermented vegetables. Eat vegetables high in vitamin C, such as leafy greens, potatoes, bell peppers, and tomatoes, with iron -rich foods. Grains Iron -fortified breakfast cereal. Iron -fortified whole-wheat bread. Enriched rice. Sprouted grains. Meats and other proteins Beef liver. Beef. [place]. Chicken. Oysters. Shrimp. Tuna. Sardines. Chickpeas. Nuts. Tofu. Pumpkin seeds. Beverages Tomato juice. Fresh orange juice. Prune juice. Hibiscus tea. Iron -fortified instant breakfast shakes. Sweets and desserts Blackstrap molasses. Seasonings and condiments Tahini. Fermented soy sauce. Other foods Wheat germ. The items listed above may not be a complete list of recommended foods and beverages. Contact a dietitian for more information. What foods should I limit? These are foods that should be limited while eating iron -rich foods as they can reduce the absorption of iron  in your body. Grains Whole grains. Bran cereal. Bran flour. Meats and other proteins Soybeans. Products made from soy protein. Black beans. Lentils. Mung beans. Split peas. Dairy Milk. Cream. Cheese. Yogurt. Cottage cheese. Beverages Coffee. Black tea. Red wine. Sweets and desserts Cocoa. Chocolate. Ice cream. Seasonings and condiments Basil. Oregano. Large amounts of parsley. The items listed above may not be a complete list of foods and beverages you should limit. Contact a dietitian for  more information. Summary Iron  is a mineral that helps your body produce hemoglobin. Hemoglobin is a protein in red blood cells that  carries oxygen to your body's tissues. Iron  is naturally found in many foods, and many foods have iron  added to them (are iron -fortified). When you eat foods that contain iron , you should eat them with foods that are high in vitamin C. Vitamin C helps your body absorb iron . Certain foods and drinks prevent your body from absorbing iron  properly, such as whole grains and dairy products. You should avoid eating these foods in the same meal as iron -rich foods or with iron  supplements. This information is not intended to replace advice given to you by your health care provider. Make sure you discuss any questions you have with your health care provider. Document Revised: 09/29/2023 Document Reviewed: 09/29/2023 Elsevier Patient Education  2025 Elsevier Inc.Iron  Sucrose Injection What is this medication? IRON  SUCROSE (EYE ern SOO krose) treats low levels of iron  (iron  deficiency anemia) in people with kidney disease. Iron  is a mineral that plays an important role in making red blood cells, which carry oxygen from your lungs to the rest of your body. This medicine may be used for other purposes; ask your health care provider or pharmacist if you have questions. COMMON BRAND NAME(S): Venofer What should I tell my care team before I take this medication? They need to know if you have any of these conditions: Anemia not caused by low iron  levels Heart disease High levels of iron  in the blood Kidney disease Liver disease An unusual or allergic reaction to iron , other medications, foods, dyes, or preservatives Pregnant or trying to get pregnant Breastfeeding How should I use this medication? This medication is infused into a vein. It is given by your care team in a hospital or clinic setting. Talk to your care team about the use of this medication in children. While it may be prescribed for children as young as 2 years for selected conditions, precautions do apply. Overdosage: If you think you have  taken too much of this medicine contact a poison control center or emergency room at once. NOTE: This medicine is only for you. Do not share this medicine with others. What if I miss a dose? Keep appointments for follow-up doses. It is important not to miss your dose. Call your care team if you are unable to keep an appointment. What may interact with this medication? Do not take this medication with any of the following: Deferoxamine Dimercaprol Other iron  products This medication may also interact with the following: Chloramphenicol Deferasirox This list may not describe all possible interactions. Give your health care provider a list of all the medicines, herbs, non-prescription drugs, or dietary supplements you use. Also tell them if you smoke, drink alcohol , or use illegal drugs. Some items may interact with your medicine. What should I watch for while using this medication? Visit your care team for regular checks on your progress. Tell your care team if your symptoms do not start to get better or if they get worse. You may need blood work done while you are taking this medication. You may need to eat more foods that contain iron . Talk to your care team. Foods that contain iron  include whole grains or cereals, dried fruits, beans, peas, leafy green vegetables, and organ meats (liver, kidney). What side effects may I notice from receiving this medication? Side effects that you should report to your care team as soon as possible: Allergic reactions--skin rash,  itching, hives, swelling of the face, lips, tongue, or throat Low blood pressure--dizziness, feeling faint or lightheaded, blurry vision Shortness of breath Side effects that usually do not require medical attention (report to your care team if they continue or are bothersome): Flushing Headache Joint pain Muscle pain Nausea Pain, redness, or irritation at injection site This list may not describe all possible side effects. Call  your doctor for medical advice about side effects. You may report side effects to FDA at 1-800-FDA-1088. Where should I keep my medication? This medication is given in a hospital or clinic. It will not be stored at home. NOTE: This sheet is a summary. It may not cover all possible information. If you have questions about this medicine, talk to your doctor, pharmacist, or health care provider.  2024 Elsevier/Gold Standard (2023-06-05 00:00:00)

## 2024-04-22 LAB — MULTIPLE MYELOMA PANEL, SERUM
Albumin SerPl Elph-Mcnc: 3.7 g/dL (ref 2.9–4.4)
Albumin/Glob SerPl: 1.3 (ref 0.7–1.7)
Alpha 1: 0.2 g/dL (ref 0.0–0.4)
Alpha2 Glob SerPl Elph-Mcnc: 0.7 g/dL (ref 0.4–1.0)
B-Globulin SerPl Elph-Mcnc: 1.2 g/dL (ref 0.7–1.3)
Gamma Glob SerPl Elph-Mcnc: 0.9 g/dL (ref 0.4–1.8)
Globulin, Total: 3 g/dL (ref 2.2–3.9)
IgA: 222 mg/dL (ref 87–352)
IgG (Immunoglobin G), Serum: 1027 mg/dL (ref 586–1602)
IgM (Immunoglobulin M), Srm: 57 mg/dL (ref 26–217)
Total Protein ELP: 6.7 g/dL (ref 6.0–8.5)

## 2024-04-24 ENCOUNTER — Inpatient Hospital Stay

## 2024-04-24 VITALS — BP 89/59 | HR 63 | Temp 98.4°F | Resp 18

## 2024-04-24 DIAGNOSIS — D649 Anemia, unspecified: Secondary | ICD-10-CM

## 2024-04-24 DIAGNOSIS — D509 Iron deficiency anemia, unspecified: Secondary | ICD-10-CM | POA: Diagnosis not present

## 2024-04-24 MED ORDER — ACETAMINOPHEN 325 MG PO TABS
650.0000 mg | ORAL_TABLET | Freq: Once | ORAL | Status: AC
Start: 2024-04-24 — End: 2024-04-24
  Administered 2024-04-24: 650 mg via ORAL
  Filled 2024-04-24: qty 2

## 2024-04-24 MED ORDER — SODIUM CHLORIDE 0.9 % IV SOLN: INTRAVENOUS | Status: DC

## 2024-04-24 MED ORDER — SODIUM CHLORIDE 0.9% FLUSH: 10.0000 mL | Freq: Once | INTRAVENOUS | Status: DC | PRN

## 2024-04-24 MED ORDER — LORATADINE 10 MG PO TABS
10.0000 mg | ORAL_TABLET | Freq: Once | ORAL | Status: AC
Start: 2024-04-24 — End: 2024-04-24
  Administered 2024-04-24: 10 mg via ORAL
  Filled 2024-04-24: qty 1

## 2024-04-24 MED ORDER — IRON SUCROSE 20 MG/ML IV SOLN
200.0000 mg | Freq: Once | INTRAVENOUS | Status: AC
  Administered 2024-04-24: 200 mg via INTRAVENOUS
  Filled 2024-04-24: qty 10

## 2024-04-24 NOTE — Patient Instructions (Signed)

## 2024-04-27 ENCOUNTER — Inpatient Hospital Stay

## 2024-04-27 VITALS — BP 110/59 | HR 62 | Temp 98.2°F | Resp 16

## 2024-04-27 DIAGNOSIS — D649 Anemia, unspecified: Secondary | ICD-10-CM

## 2024-04-27 DIAGNOSIS — D509 Iron deficiency anemia, unspecified: Secondary | ICD-10-CM | POA: Diagnosis not present

## 2024-04-27 MED ORDER — LORATADINE 10 MG PO TABS
10.0000 mg | ORAL_TABLET | Freq: Once | ORAL | Status: AC
  Administered 2024-04-27: 10 mg via ORAL
  Filled 2024-04-27: qty 1

## 2024-04-27 MED ORDER — SODIUM CHLORIDE 0.9 % IV SOLN: INTRAVENOUS | Status: DC

## 2024-04-27 MED ORDER — IRON SUCROSE 20 MG/ML IV SOLN
200.0000 mg | Freq: Once | INTRAVENOUS | Status: AC
  Administered 2024-04-27: 200 mg via INTRAVENOUS
  Filled 2024-04-27: qty 10

## 2024-04-27 MED ORDER — ACETAMINOPHEN 325 MG PO TABS
650.0000 mg | ORAL_TABLET | Freq: Once | ORAL | Status: AC
  Administered 2024-04-27: 650 mg via ORAL
  Filled 2024-04-27: qty 2

## 2024-04-27 NOTE — Patient Instructions (Signed)

## 2024-04-29 ENCOUNTER — Inpatient Hospital Stay: Attending: Hematology and Oncology

## 2024-04-29 VITALS — BP 112/64 | HR 64 | Temp 98.8°F | Resp 18

## 2024-04-29 DIAGNOSIS — D509 Iron deficiency anemia, unspecified: Secondary | ICD-10-CM | POA: Diagnosis present

## 2024-04-29 DIAGNOSIS — D649 Anemia, unspecified: Secondary | ICD-10-CM

## 2024-04-29 MED ORDER — LORATADINE 10 MG PO TABS
10.0000 mg | ORAL_TABLET | Freq: Once | ORAL | Status: AC
  Administered 2024-04-29: 10 mg via ORAL
  Filled 2024-04-29: qty 1

## 2024-04-29 MED ORDER — ACETAMINOPHEN 325 MG PO TABS
650.0000 mg | ORAL_TABLET | Freq: Once | ORAL | Status: AC
  Administered 2024-04-29: 650 mg via ORAL
  Filled 2024-04-29: qty 2

## 2024-04-29 MED ORDER — SODIUM CHLORIDE 0.9 % IV SOLN: INTRAVENOUS | Status: DC

## 2024-04-29 MED ORDER — IRON SUCROSE 20 MG/ML IV SOLN
200.0000 mg | Freq: Once | INTRAVENOUS | Status: AC
  Administered 2024-04-29: 200 mg via INTRAVENOUS
  Filled 2024-04-29: qty 10

## 2024-04-29 NOTE — Patient Instructions (Signed)

## 2024-04-30 ENCOUNTER — Inpatient Hospital Stay

## 2024-04-30 VITALS — BP 112/58 | HR 61 | Temp 98.6°F | Resp 16

## 2024-04-30 DIAGNOSIS — D509 Iron deficiency anemia, unspecified: Secondary | ICD-10-CM | POA: Diagnosis not present

## 2024-04-30 DIAGNOSIS — D649 Anemia, unspecified: Secondary | ICD-10-CM

## 2024-04-30 MED ORDER — SODIUM CHLORIDE 0.9 % IV SOLN: INTRAVENOUS | Status: DC

## 2024-04-30 MED ORDER — LORATADINE 10 MG PO TABS
10.0000 mg | ORAL_TABLET | Freq: Once | ORAL | Status: AC
  Administered 2024-04-30: 10 mg via ORAL
  Filled 2024-04-30: qty 1

## 2024-04-30 MED ORDER — IRON SUCROSE 20 MG/ML IV SOLN
200.0000 mg | Freq: Once | INTRAVENOUS | Status: AC
  Administered 2024-04-30: 200 mg via INTRAVENOUS
  Filled 2024-04-30: qty 10

## 2024-04-30 MED ORDER — ACETAMINOPHEN 325 MG PO TABS
650.0000 mg | ORAL_TABLET | Freq: Once | ORAL | Status: AC
  Administered 2024-04-30: 650 mg via ORAL
  Filled 2024-04-30: qty 2

## 2024-04-30 NOTE — Patient Instructions (Signed)

## 2024-06-01 ENCOUNTER — Other Ambulatory Visit: Payer: Self-pay

## 2024-06-01 ENCOUNTER — Inpatient Hospital Stay (HOSPITAL_BASED_OUTPATIENT_CLINIC_OR_DEPARTMENT_OTHER): Admitting: Hematology and Oncology

## 2024-06-01 ENCOUNTER — Inpatient Hospital Stay: Attending: Hematology and Oncology

## 2024-06-01 VITALS — HR 62 | Temp 98.7°F | Resp 18 | Ht 70.9 in | Wt 255.0 lb

## 2024-06-01 DIAGNOSIS — E039 Hypothyroidism, unspecified: Secondary | ICD-10-CM | POA: Insufficient documentation

## 2024-06-01 DIAGNOSIS — Z803 Family history of malignant neoplasm of breast: Secondary | ICD-10-CM | POA: Diagnosis not present

## 2024-06-01 DIAGNOSIS — Z800 Family history of malignant neoplasm of digestive organs: Secondary | ICD-10-CM | POA: Diagnosis not present

## 2024-06-01 DIAGNOSIS — Z8249 Family history of ischemic heart disease and other diseases of the circulatory system: Secondary | ICD-10-CM | POA: Insufficient documentation

## 2024-06-01 DIAGNOSIS — Z79899 Other long term (current) drug therapy: Secondary | ICD-10-CM | POA: Insufficient documentation

## 2024-06-01 DIAGNOSIS — Z833 Family history of diabetes mellitus: Secondary | ICD-10-CM | POA: Insufficient documentation

## 2024-06-01 DIAGNOSIS — D509 Iron deficiency anemia, unspecified: Secondary | ICD-10-CM | POA: Diagnosis not present

## 2024-06-01 DIAGNOSIS — Z79890 Hormone replacement therapy (postmenopausal): Secondary | ICD-10-CM | POA: Diagnosis not present

## 2024-06-01 LAB — CMP (CANCER CENTER ONLY)
ALT: 35 U/L (ref 0–44)
AST: 32 U/L (ref 15–41)
Albumin: 4 g/dL (ref 3.5–5.0)
Alkaline Phosphatase: 80 U/L (ref 38–126)
Anion gap: 11 (ref 5–15)
BUN: 7 mg/dL (ref 6–20)
CO2: 22 mmol/L (ref 22–32)
Calcium: 9.5 mg/dL (ref 8.9–10.3)
Chloride: 107 mmol/L (ref 98–111)
Creatinine: 0.73 mg/dL (ref 0.44–1.00)
GFR, Estimated: >60 mL/min (ref >60)
Glucose, Bld: 83 mg/dL (ref 70–99)
Potassium: 4 mmol/L (ref 3.5–5.1)
Sodium: 139 mmol/L (ref 135–145)
Total Bilirubin: 0.5 mg/dL (ref 0.0–1.2)
Total Protein: 6.6 g/dL (ref 6.5–8.1)

## 2024-06-01 LAB — CBC WITH DIFFERENTIAL (CANCER CENTER ONLY)
Abs Immature Granulocytes: 0.01 K/uL (ref 0.00–0.07)
Basophils Absolute: 0 K/uL (ref 0.0–0.1)
Basophils Relative: 1 %
Eosinophils Absolute: 0.1 K/uL (ref 0.0–0.5)
Eosinophils Relative: 1 %
HCT: 38.1 % (ref 36.0–46.0)
Hemoglobin: 12.6 g/dL (ref 12.0–15.0)
Immature Granulocytes: 0 %
Lymphocytes Relative: 28 %
Lymphs Abs: 1.9 K/uL (ref 0.7–4.0)
MCH: 27.8 pg (ref 26.0–34.0)
MCHC: 33.1 g/dL (ref 30.0–36.0)
MCV: 84.1 fL (ref 80.0–100.0)
Monocytes Absolute: 0.4 K/uL (ref 0.1–1.0)
Monocytes Relative: 7 %
Neutro Abs: 4.3 K/uL (ref 1.7–7.7)
Neutrophils Relative %: 63 %
Platelet Count: 242 K/uL (ref 150–400)
RBC: 4.53 MIL/uL (ref 3.87–5.11)
RDW: 15.4 % (ref 11.5–15.5)
WBC Count: 6.7 K/uL (ref 4.0–10.5)
nRBC: 0 % (ref 0.0–0.2)

## 2024-06-01 LAB — IRON AND TIBC
Iron: 95 ug/dL (ref 28–170)
Saturation Ratios: 24 % (ref 10.4–31.8)
TIBC: 392 ug/dL (ref 250–450)
UIBC: 297 ug/dL

## 2024-06-01 LAB — FERRITIN: Ferritin: 59 ng/mL (ref 11–307)

## 2024-06-01 LAB — TSH: TSH: 3.532 u[IU]/mL (ref 0.350–4.500)

## 2024-06-01 LAB — FOLATE: Folate: >40 ng/mL (ref >5.9)

## 2024-06-01 LAB — VITAMIN B12: Vitamin B-12: 423 pg/mL (ref 180–914)

## 2024-06-01 NOTE — Progress Notes (Signed)
 [facility] [address] [place],  [place]  [address] [phone]  Clinic Day:  06/12/2024   Referring physician: Thurmond Cathlyn LABOR., MD  Patient Care Team: Patient Care Team: Thurmond Cathlyn LABOR., MD as PCP - General (Internal Medicine)   REASON FOR CONSULTATION:  Iron  deficiency anemia  HISTORY OF PRESENT ILLNESS:   Sarah Hess is a 43 y.o. female with a history of iron  deficiency anemia who is referred in consultation by Cathlyn Thurmond, MD for assessment and management. She has a long history of iron  deficiency anemia and has received iron  infusions in the past. She has been on oral replacement recently that has been ineffective. She reports increasing fatigue without shortness of breath or chest pain. She denies fever, chills, nausea or vomiting. She denies issue with bowel or bladder. She denies changes in her appetite or weight. Medical history is significant for ADHD, iron  deficiency anemia, mixed hyperlipidemia, prediabetes, osteoarthritis, hypothyroidism. Surgical history includes mammoplasty reduction. She is being evaluated for hernia repair. Family history is significant for coronary artery disease, HTN, and stroke. She is a never smoker and drinks a couple of glasses of wine a month. She also takes recreational edibles.    REVIEW OF SYSTEMS:   Review of Systems  Constitutional:  Positive for fatigue.  HENT:  Negative.    Eyes: Negative.   Respiratory: Negative.    Cardiovascular: Negative.   Gastrointestinal: Negative.   Endocrine: Negative.   Genitourinary: Negative.    Musculoskeletal: Negative.   Skin: Negative.   Neurological: Negative.   Hematological: Negative.   Psychiatric/Behavioral: Negative.       VITALS:   Pulse 62, temperature 98.7 F (37.1 C), temperature source Oral, resp. rate 18, height 5' 10.9 (1.801 m), weight 255 lb (115.7 kg), SpO2 97%, unknown if currently breastfeeding.  Wt Readings from Last 3 Encounters:  06/01/24 255  lb (115.7 kg)  04/20/24 253 lb 4.8 oz (114.9 kg)  08/29/17 239 lb 8 oz (108.6 kg)    Body mass index is 35.67 kg/m.  Performance status (ECOG): 1 - Symptomatic but completely ambulatory  PHYSICAL EXAM:   Physical Exam Constitutional:      Appearance: Normal appearance. She is normal weight.  HENT:     Head: Normocephalic and atraumatic.     Mouth/Throat:     Mouth: Mucous membranes are moist.  Cardiovascular:     Rate and Rhythm: Normal rate and regular rhythm.     Pulses: Normal pulses.     Heart sounds: Normal heart sounds.  Pulmonary:     Effort: Pulmonary effort is normal.     Breath sounds: Normal breath sounds.  Abdominal:     Palpations: Abdomen is soft.  Musculoskeletal:        General: Normal range of motion.     Cervical back: Normal range of motion.  Skin:    General: Skin is warm and dry.  Neurological:     General: No focal deficit present.     Mental Status: She is alert and oriented to person, place, and time. Mental status is at baseline.  Psychiatric:        Mood and Affect: Mood normal.        Behavior: Behavior normal.        Thought Content: Thought content normal.        Judgment: Judgment normal.      LABS:      Latest Ref Rng & Units 06/01/2024    2:43 PM  04/20/2024   10:14 AM 08/30/2017    5:43 AM  CBC  WBC 4.0 - 10.5 K/uL 6.7  7.1  13.9   Hemoglobin 12.0 - 15.0 g/dL 87.3  87.3  8.1   Hematocrit 36.0 - 46.0 % 38.1  39.0  24.5   Platelets 150 - 400 K/uL 242  277  186       Latest Ref Rng & Units 06/01/2024    2:43 PM 04/20/2024   10:14 AM 08/30/2017    7:45 AM  CMP  Glucose 70 - 99 mg/dL 83  86  97   BUN 6 - 20 mg/dL 7  8  15    Creatinine 0.44 - 1.00 mg/dL 9.26  9.22  9.20   Sodium 135 - 145 mmol/L 139  139  132   Potassium 3.5 - 5.1 mmol/L 4.0  4.3  4.7   Chloride 98 - 111 mmol/L 107  106  105   CO2 22 - 32 mmol/L 22  23  22    Calcium  8.9 - 10.3 mg/dL 9.5  9.7  8.1   Total Protein 6.5 - 8.1 g/dL 6.6  7.4  5.1   Total Bilirubin  0.0 - 1.2 mg/dL 0.5  0.8  0.4   Alkaline Phos 38 - 126 U/L 80  76  62   AST 15 - 41 U/L 32  25  24   ALT 0 - 44 U/L 35  21  21      No results found for: CEA1, CEA / No results found for: CEA1, CEA No results found for: PSA1 No results found for: CAN199 No results found for: RJW874  Lab Results  Component Value Date   TOTALPROTELP 6.7 04/20/2024   Lab Results  Component Value Date   TIBC 392 06/01/2024   TIBC 547 (H) 04/20/2024   FERRITIN 59 06/01/2024   FERRITIN 4 (L) 04/20/2024   IRONPCTSAT 24 06/01/2024   IRONPCTSAT 11 04/20/2024   Lab Results  Component Value Date   LDH 139 04/20/2024    STUDIES:   No results found.    HISTORY:   No past medical history on file.    Family History  Problem Relation Age of Onset   Breast cancer Mother    Uterine cancer Mother    Heart attack Father    Diabetes Sister    Breast cancer Sister    Breast cancer Sister    Cancer Paternal Uncle        Gliobastoma   Heart disease Maternal Grandmother        CABG   Prostate cancer Maternal Grandfather    Colon cancer Maternal Grandfather    Heart disease Maternal Grandfather        CABG   Heart disease Paternal Grandmother        CABG   Ovarian cancer Paternal Grandmother    Dementia Paternal Grandmother    Alzheimer's disease Paternal Grandmother    Heart attack Paternal Grandfather     Social History:  reports that she has quit smoking. Her smoking use included cigarettes. She has never used smokeless tobacco. She reports current alcohol use. She reports that she does not currently use drugs.The patient is alone today.  Allergies:  Allergies  Allergen Reactions   Pineapple Anaphylaxis    Only while pregnant, able to eat it now.    Current Medications: Current Outpatient Medications  Medication Sig Dispense Refill   atorvastatin (LIPITOR) 20 MG tablet Take 20 mg by mouth daily.  Calcium  Carb-Cholecalciferol (OYSTER SHELL CALCIUM  W/D) 500-5  MG-MCG TABS Take 1 tablet by mouth daily.     Cyanocobalamin  (PHYSICIANS EZ USE B-12) 1000 MCG/ML KIT Inject 1 kit into the muscle every 30 (thirty) days.     ferrous sulfate  325 (65 FE) MG tablet Take 1 tablet (325 mg total) daily with breakfast by mouth.  3   levothyroxine  (SYNTHROID , LEVOTHROID) 175 MCG tablet Take 175 mcg by mouth daily before breakfast.     Prenatal Vit-Fe Fumarate-FA (PRENATAL MULTIVITAMIN) TABS tablet Take 1 tablet by mouth daily at 12 noon.     No current facility-administered medications for this visit.     ASSESSMENT & PLAN:   Assessment:  Sarah Hess is a 43 y.o. female with history of iron  deficiency anemia. She has been using oral iron  supplement without success. She has had IV iron  in the past and is in agreement to this treatment plan again. She is preparing for hernia repair surgery and needs to have her iron  levels improved.She completed IV Venofer  and lab results today reveal that she responded well to iron  with saturation increasing to 24 from 11 and improvement in her symptoms.   Plan: 1.  RTC in 3 months for follow up  I discussed the assessment and treatment plan with the patient.  The patient was provided an opportunity to ask questions and all were answered.  The patient agreed with the plan and demonstrated an understanding of the instructions.    Thank you for the referral    20 minutes was spent in patient care.  This included time spent preparing to see the patient (e.g., review of tests), obtaining and/or reviewing separately obtained history, counseling and educating the patient/family/caregiver, ordering medications, tests, or procedures; documenting clinical information in the electronic or other health record, independently interpreting results and communicating results to the patient/family/caregiver as well as coordination of care.      Eleanor DELENA Bach, NP   Family Nurse Practitioner - Board Certified [facility]  [place] [phone]

## 2024-06-02 ENCOUNTER — Telehealth: Payer: Self-pay | Admitting: Hematology and Oncology

## 2024-06-02 NOTE — Telephone Encounter (Signed)
 Patient has been scheduled for follow-up visit per 05/29/24 LOS.  LVM notifying pt of appt details, provided my direct number to pt if appt changes need to be made.

## 2024-06-10 ENCOUNTER — Other Ambulatory Visit: Payer: Self-pay | Admitting: Medical Genetics

## 2024-06-12 ENCOUNTER — Encounter: Payer: Self-pay | Admitting: Hematology and Oncology

## 2024-07-22 ENCOUNTER — Other Ambulatory Visit (HOSPITAL_COMMUNITY)

## 2024-08-07 ENCOUNTER — Other Ambulatory Visit: Payer: Self-pay | Admitting: *Deleted

## 2024-08-07 DIAGNOSIS — Z006 Encounter for examination for normal comparison and control in clinical research program: Secondary | ICD-10-CM

## 2024-08-28 NOTE — Progress Notes (Signed)
 Referring-Greg Thurmond MD Reason for referral-Palpitations and chest pain  HPI: 43 yo female for evaluation of palpitations and chest pain at request of Cathlyn Thurmond MD. Previously followed by Dr Raylene at [facility] but transitioning to me. Apparently had abnormal Ca score 2015; full report not available. Echo 7/23 at [facility] with normal LV function. Monitor 7/23 at [facility] report not available but no significant arrhythmias per pt. Labs 8/25 showed K 4, normal TSH and Hgb.  Patient states that over the past 2 months she has had occasional chest pressure.  It is in the substernal area without radiation.  There is some nausea but no diaphoresis or dyspnea.  Last 10 to 20 minutes and resolve spontaneously.  Not pleuritic, related to food and not clearly exertional.  Most recent episode was this morning for 10 to 15 minutes.  Presently pain-free.  Also notes occasional palpitations described as a skipped.  She has recorded some strips that show sinus with PVCs.  No syncope or dyspnea.  Current Outpatient Medications  Medication Sig Dispense Refill   atorvastatin (LIPITOR) 20 MG tablet Take 20 mg by mouth daily.     Calcium  Carb-Cholecalciferol (OYSTER SHELL CALCIUM  W/D) 500-5 MG-MCG TABS Take 1 tablet by mouth daily.     Cyanocobalamin  (PHYSICIANS EZ USE B-12) 1000 MCG/ML KIT Inject 1 kit into the muscle every 30 (thirty) days.     ferrous sulfate  325 (65 FE) MG tablet Take 1 tablet (325 mg total) daily with breakfast by mouth.  3   levothyroxine  (SYNTHROID , LEVOTHROID) 175 MCG tablet Take 175 mcg by mouth daily before breakfast.     Prenatal Vit-Fe Fumarate-FA (PRENATAL MULTIVITAMIN) TABS tablet Take 1 tablet by mouth daily at 12 noon.     psyllium (REGULOID) 0.52 g capsule Take 0.52 g by mouth daily.     No current facility-administered medications for this visit.    Allergies  Allergen Reactions   Pineapple Anaphylaxis    Only while pregnant, able to eat it now.     Past Medical History:   Diagnosis Date   Anemia    Hyperlipidemia    Hypothyroid     Past Surgical History:  Procedure Laterality Date   ACHILLES TENDON LENGTHENING     BREAST REDUCTION SURGERY     CESAREAN SECTION N/A 08/29/2017   Procedure: CESAREAN SECTION;  Surgeon: Gorge Ade, MD;  Location: [facility];  Service: Obstetrics;  Laterality: N/A;   CESAREAN SECTION     CHOLECYSTECTOMY     LAPAROSCOPIC GASTRIC BYPASS     ORIF FEMORAL SHAFT FRACTURE W/ PLATES AND SCREWS     Multiple surgeries    Social History   Socioeconomic History   Marital status: Married    Spouse name: Not on file   Number of children: 2   Years of education: Not on file   Highest education level: Not on file  Occupational History   Not on file  Tobacco Use   Smoking status: Former    Types: Cigarettes   Smokeless tobacco: Never  Vaping Use   Vaping status: Never Used  Substance and Sexual Activity   Alcohol use: Yes    Comment: once a month   Drug use: Not Currently    Comment: [place] on occasional   Sexual activity: Not on file  Other Topics Concern   Not on file  Social History Narrative   Not on file   Social Drivers of Health   Financial Resource Strain: Medium Risk (  06/15/2021)   Received from [facility] visits prior to 12/29/2022.   Overall Financial Resource Strain (CARDIA)    Difficulty of Paying Living Expenses: Somewhat hard  Food Insecurity: Low Risk  (03/29/2024)   Received from [facility]   Hunger Vital Sign    Within the past 12 months, you worried that your food would run out before you got money to buy more: Never true    Within the past 12 months, the food you bought just didn't last and you didn't have money to get more. : Never true  Transportation Needs: No Transportation Needs (03/29/2024)   Received from [facility]    In the past 12 months, has lack of reliable transportation kept you from medical appointments, meetings, work or from  getting things needed for daily living? : No  Physical Activity: Insufficiently Active (06/15/2021)   Received from [facility] visits prior to 12/29/2022.   Exercise Vital Sign    On average, how many days per week do you engage in moderate to strenuous exercise (like a brisk walk)?: 1 day    On average, how many minutes do you engage in exercise at this level?: 20 min  Stress: No Stress Concern Present (06/15/2021)   Received from [facility] visits prior to 12/29/2022.   [employer] - Occupational Stress Questionnaire    Feeling of Stress : Only a little  Social Connections: Moderately Isolated (06/15/2021)   Received from [facility] visits prior to 12/29/2022.   Social Connection and Isolation Panel    In a typical week, how many times do you talk on the phone with family, friends, or neighbors?: More than three times a week    How often do you get together with friends or relatives?: Once a week    How often do you attend church or religious services?: Never    Do you belong to any clubs or organizations such as church groups, unions, fraternal or athletic groups, or school groups?: No    How often do you attend meetings of the clubs or organizations you belong to?: Never    Are you married, widowed, divorced, separated, never married, or living with a partner?: Married  Intimate Partner Violence: Not At Risk (06/15/2021)   Received from [facility] visits prior to 12/29/2022.   Humiliation, Afraid, Rape, and Kick questionnaire    Within the last year, have you been afraid of your partner or ex-partner?: No    Within the last year, have you been humiliated or emotionally abused in other ways by your partner or ex-partner?: No    Within the last year, have you been kicked, hit, slapped, or otherwise physically hurt by your partner or ex-partner?: No    Within the last year, have you  been raped or forced to have any kind of sexual activity by your partner or ex-partner?: No    Family History  Problem Relation Age of Onset   Breast cancer Mother    Uterine cancer Mother    Heart disease Father    Heart attack Father    Diabetes Sister    Breast cancer Sister    Breast cancer Sister    Heart disease Maternal Grandmother        CABG   Prostate cancer Maternal Grandfather    Colon cancer Maternal Grandfather    Heart disease Maternal Grandfather  CABG   Heart disease Paternal Grandmother        CABG   Ovarian cancer Paternal Grandmother    Dementia Paternal Grandmother    Alzheimer's disease Paternal Grandmother    Heart attack Paternal Grandfather    Cancer Paternal Uncle        Gliobastoma    ROS: no fevers or chills, productive cough, hemoptysis, dysphasia, odynophagia, melena, hematochezia, dysuria, hematuria, rash, seizure activity, orthopnea, PND, pedal edema, claudication. Remaining systems are negative.  Physical Exam:   Blood pressure 109/64, pulse 94, height 5' 11 (1.803 m), weight 250 lb (113.4 kg), last menstrual period 08/28/2024, SpO2 97%, unknown if currently breastfeeding.  General:  Well developed/well nourished in NAD Skin warm/dry Patient not depressed No peripheral clubbing Back-normal HEENT-normal/normal eyelids Neck supple/normal carotid upstroke bilaterally; no bruits; no JVD; no thyromegaly chest - CTA/ normal expansion CV - RRR/normal S1 and S2; no murmurs, rubs or gallops;  PMI nondisplaced Abdomen -NT/ND, no HSM, no mass, + bowel sounds, no bruit 2+ femoral pulses, no bruits Ext-no edema, chords, 2+ DP Neuro-grossly nonfocal  EKG Interpretation Date/Time:  Monday September 07 2024 14:40:23 EST Ventricular Rate:  94 PR Interval:  126 QRS Duration:  78 QT Interval:  364 QTC Calculation: 455 R Axis:   38  Text Interpretation: Normal sinus rhythm  Nonspecific T wave changes Confirmed by Pietro Rogue ([id number]) on  09/07/2024 2:41:57 PM    A/P  1 chest pain-symptoms with both typical and atypical features.  However her electrocardiogram shows lateral T wave inversion which is new compared to previous.  She also had chest pain at rest this morning.  She has a strong family history with a sister who had PCI of LAD at 67, father had myocardial infarction at 10 and a grandmother with coronary disease.  I am concerned about her symptoms at rest with ECG changes.  I will admit to [facility] and cycle enzymes.  Will treat with aspirin, heparin, metoprolol and statin.  Plan to proceed with cardiac catheterization.  The risk and benefits including myocardial infarction, CVA and death discussed and she agrees to proceed.  2 Coronary calcification-apparently noted on previous calcium  score.  Add aspirin 81 mg daily and continue statin.  3 Hyperlipidemia-increase Lipitor to 80 mg daily.  Check lipids and liver in 8 weeks.  4 palpitations-she did record strips and is noted to have PVCs.  Rogue Pietro, MD

## 2024-09-01 ENCOUNTER — Inpatient Hospital Stay: Attending: Hematology and Oncology | Admitting: Hematology and Oncology

## 2024-09-01 ENCOUNTER — Encounter: Payer: Self-pay | Admitting: Hematology and Oncology

## 2024-09-01 ENCOUNTER — Other Ambulatory Visit: Payer: Self-pay

## 2024-09-01 ENCOUNTER — Telehealth: Payer: Self-pay | Admitting: Hematology and Oncology

## 2024-09-01 ENCOUNTER — Inpatient Hospital Stay

## 2024-09-01 ENCOUNTER — Other Ambulatory Visit: Payer: Self-pay | Admitting: Hematology and Oncology

## 2024-09-01 VITALS — BP 120/42 | HR 60 | Temp 98.2°F | Resp 18 | Ht 70.9 in | Wt 249.9 lb

## 2024-09-01 DIAGNOSIS — Z79899 Other long term (current) drug therapy: Secondary | ICD-10-CM | POA: Diagnosis not present

## 2024-09-01 DIAGNOSIS — D509 Iron deficiency anemia, unspecified: Secondary | ICD-10-CM

## 2024-09-01 DIAGNOSIS — Z800 Family history of malignant neoplasm of digestive organs: Secondary | ICD-10-CM | POA: Insufficient documentation

## 2024-09-01 DIAGNOSIS — Z8041 Family history of malignant neoplasm of ovary: Secondary | ICD-10-CM | POA: Diagnosis not present

## 2024-09-01 DIAGNOSIS — Z803 Family history of malignant neoplasm of breast: Secondary | ICD-10-CM | POA: Diagnosis not present

## 2024-09-01 DIAGNOSIS — E039 Hypothyroidism, unspecified: Secondary | ICD-10-CM | POA: Insufficient documentation

## 2024-09-01 DIAGNOSIS — E538 Deficiency of other specified B group vitamins: Secondary | ICD-10-CM | POA: Insufficient documentation

## 2024-09-01 LAB — CBC WITH DIFFERENTIAL (CANCER CENTER ONLY)
Abs Immature Granulocytes: 0.02 K/uL (ref 0.00–0.07)
Basophils Absolute: 0 K/uL (ref 0.0–0.1)
Basophils Relative: 0 %
Eosinophils Absolute: 0 K/uL (ref 0.0–0.5)
Eosinophils Relative: 1 %
HCT: 41.5 % (ref 36.0–46.0)
Hemoglobin: 14.1 g/dL (ref 12.0–15.0)
Immature Granulocytes: 0 %
Lymphocytes Relative: 23 %
Lymphs Abs: 1.8 K/uL (ref 0.7–4.0)
MCH: 29 pg (ref 26.0–34.0)
MCHC: 34 g/dL (ref 30.0–36.0)
MCV: 85.4 fL (ref 80.0–100.0)
Monocytes Absolute: 0.5 K/uL (ref 0.1–1.0)
Monocytes Relative: 6 %
Neutro Abs: 5.4 K/uL (ref 1.7–7.7)
Neutrophils Relative %: 70 %
Platelet Count: 272 K/uL (ref 150–400)
RBC: 4.86 MIL/uL (ref 3.87–5.11)
RDW: 12.3 % (ref 11.5–15.5)
WBC Count: 7.8 K/uL (ref 4.0–10.5)
nRBC: 0 % (ref 0.0–0.2)

## 2024-09-01 LAB — VITAMIN B12: Vitamin B-12: 474 pg/mL (ref 180–914)

## 2024-09-01 LAB — CMP (CANCER CENTER ONLY)
ALT: 23 U/L (ref 0–44)
AST: 23 U/L (ref 15–41)
Albumin: 4.4 g/dL (ref 3.5–5.0)
Alkaline Phosphatase: 83 U/L (ref 38–126)
Anion gap: 12 (ref 5–15)
BUN: 8 mg/dL (ref 6–20)
CO2: 24 mmol/L (ref 22–32)
Calcium: 9.7 mg/dL (ref 8.9–10.3)
Chloride: 104 mmol/L (ref 98–111)
Creatinine: 0.72 mg/dL (ref 0.44–1.00)
GFR, Estimated: >60 mL/min (ref >60)
Glucose, Bld: 96 mg/dL (ref 70–99)
Potassium: 4.1 mmol/L (ref 3.5–5.1)
Sodium: 139 mmol/L (ref 135–145)
Total Bilirubin: 0.8 mg/dL (ref 0.0–1.2)
Total Protein: 7.1 g/dL (ref 6.5–8.1)

## 2024-09-01 LAB — GENECONNECT MOLECULAR SCREEN: Genetic Analysis Overall Interpretation: NEGATIVE

## 2024-09-01 LAB — IRON AND TIBC
Iron: 61 ug/dL (ref 28–170)
Saturation Ratios: 16 % (ref 10.4–31.8)
TIBC: 378 ug/dL (ref 250–450)
UIBC: 317 ug/dL

## 2024-09-01 LAB — TSH: TSH: 4.06 u[IU]/mL (ref 0.350–4.500)

## 2024-09-01 LAB — FOLATE: Folate: 19.6 ng/mL (ref >5.9)

## 2024-09-01 LAB — FERRITIN: Ferritin: 131 ng/mL (ref 11–307)

## 2024-09-01 NOTE — Telephone Encounter (Signed)
 Patient has been scheduled for follow-up visit per 09/01/24 LOS.  Pt noted appt details on personal electronic device.

## 2024-09-02 ENCOUNTER — Encounter: Payer: Self-pay | Admitting: Hematology and Oncology

## 2024-09-02 NOTE — Progress Notes (Signed)
 [facility] [address] [place],  [place]  [address] [phone]  Clinic Day:  09/02/2024   Referring physician: Thurmond Cathlyn LABOR., MD  Patient Care Team: Patient Care Team: Thurmond Cathlyn LABOR., MD as PCP - General (Internal Medicine)   REASON FOR CONSULTATION:  Iron  deficiency anemia  HISTORY OF PRESENT ILLNESS:   Sarah Hess is a 43 y.o. female with a history of iron  deficiency anemia who is referred in consultation by Cathlyn Thurmond, MD for assessment and management. She has a long history of iron  deficiency anemia and has received iron  infusions in the past. She has been on oral replacement recently that has been ineffective. She reports increasing fatigue without shortness of breath or chest pain. She denies fever, chills, nausea or vomiting. She denies issue with bowel or bladder. She denies changes in her appetite or weight. Medical history is significant for ADHD, iron  deficiency anemia, mixed hyperlipidemia, prediabetes, osteoarthritis, hypothyroidism. Surgical history includes mammoplasty reduction. She is being evaluated for hernia repair. Family history is significant for coronary artery disease, HTN, and stroke. She is a never smoker and drinks a couple of glasses of wine a month. She also takes recreational edibles.    REVIEW OF SYSTEMS:   Review of Systems  Constitutional:  Positive for fatigue.  HENT:  Negative.    Eyes: Negative.   Respiratory: Negative.    Cardiovascular: Negative.   Gastrointestinal: Negative.   Endocrine: Negative.   Genitourinary: Negative.    Musculoskeletal: Negative.   Skin: Negative.   Neurological: Negative.   Hematological: Negative.   Psychiatric/Behavioral: Negative.       VITALS:   Blood pressure (!) 120/42, pulse 60, temperature 98.2 F (36.8 C), temperature source Oral, resp. rate 18, height 5' 10.9 (1.801 m), weight 249 lb 14.4 oz (113.4 kg), last menstrual period 08/28/2024, SpO2 97%, unknown if currently  breastfeeding.  Wt Readings from Last 3 Encounters:  09/01/24 249 lb 14.4 oz (113.4 kg)  06/01/24 255 lb (115.7 kg)  04/20/24 253 lb 4.8 oz (114.9 kg)    Body mass index is 34.95 kg/m.  Performance status (ECOG): 1 - Symptomatic but completely ambulatory  PHYSICAL EXAM:   Physical Exam Constitutional:      Appearance: Normal appearance. She is normal weight.  HENT:     Head: Normocephalic and atraumatic.     Mouth/Throat:     Mouth: Mucous membranes are moist.  Cardiovascular:     Rate and Rhythm: Normal rate and regular rhythm.     Pulses: Normal pulses.     Heart sounds: Normal heart sounds.  Pulmonary:     Effort: Pulmonary effort is normal.     Breath sounds: Normal breath sounds.  Abdominal:     Palpations: Abdomen is soft.  Musculoskeletal:        General: Normal range of motion.     Cervical back: Normal range of motion.  Skin:    General: Skin is warm and dry.  Neurological:     General: No focal deficit present.     Mental Status: She is alert and oriented to person, place, and time. Mental status is at baseline.  Psychiatric:        Mood and Affect: Mood normal.        Behavior: Behavior normal.        Thought Content: Thought content normal.        Judgment: Judgment normal.      LABS:      Latest  Ref Rng & Units 09/01/2024    2:29 PM 06/01/2024    2:43 PM 04/20/2024   10:14 AM  CBC  WBC 4.0 - 10.5 K/uL 7.8  6.7  7.1   Hemoglobin 12.0 - 15.0 g/dL 85.8  87.3  87.3   Hematocrit 36.0 - 46.0 % 41.5  38.1  39.0   Platelets 150 - 400 K/uL 272  242  277       Latest Ref Rng & Units 09/01/2024    2:29 PM 06/01/2024    2:43 PM 04/20/2024   10:14 AM  CMP  Glucose 70 - 99 mg/dL 96  83  86   BUN 6 - 20 mg/dL 8  7  8    Creatinine 0.44 - 1.00 mg/dL 9.27  9.26  9.22   Sodium 135 - 145 mmol/L 139  139  139   Potassium 3.5 - 5.1 mmol/L 4.1  4.0  4.3   Chloride 98 - 111 mmol/L 104  107  106   CO2 22 - 32 mmol/L 24  22  23    Calcium  8.9 - 10.3 mg/dL 9.7  9.5   9.7   Total Protein 6.5 - 8.1 g/dL 7.1  6.6  7.4   Total Bilirubin 0.0 - 1.2 mg/dL 0.8  0.5  0.8   Alkaline Phos 38 - 126 U/L 83  80  76   AST 15 - 41 U/L 23  32  25   ALT 0 - 44 U/L 23  35  21      No results found for: CEA1, CEA / No results found for: CEA1, CEA No results found for: PSA1 No results found for: CAN199 No results found for: RJW874  Lab Results  Component Value Date   TOTALPROTELP 6.7 04/20/2024   Lab Results  Component Value Date   TIBC 378 09/01/2024   TIBC 392 06/01/2024   TIBC 547 (H) 04/20/2024   FERRITIN 131 09/01/2024   FERRITIN 59 06/01/2024   FERRITIN 4 (L) 04/20/2024   IRONPCTSAT 16 09/01/2024   IRONPCTSAT 24 06/01/2024   IRONPCTSAT 11 04/20/2024   Lab Results  Component Value Date   LDH 139 04/20/2024    STUDIES:   No results found.    HISTORY:   No past medical history on file.    Family History  Problem Relation Age of Onset   Breast cancer Mother    Uterine cancer Mother    Heart attack Father    Diabetes Sister    Breast cancer Sister    Breast cancer Sister    Cancer Paternal Uncle        Gliobastoma   Heart disease Maternal Grandmother        CABG   Prostate cancer Maternal Grandfather    Colon cancer Maternal Grandfather    Heart disease Maternal Grandfather        CABG   Heart disease Paternal Grandmother        CABG   Ovarian cancer Paternal Grandmother    Dementia Paternal Grandmother    Alzheimer's disease Paternal Grandmother    Heart attack Paternal Grandfather     Social History:  reports that she has quit smoking. Her smoking use included cigarettes. She has never used smokeless tobacco. She reports current alcohol use. She reports that she does not currently use drugs.The patient is alone today.  Allergies:  Allergies  Allergen Reactions   Pineapple Anaphylaxis    Only while pregnant, able to eat it now.  Current Medications: Current Outpatient Medications  Medication Sig  Dispense Refill   atorvastatin (LIPITOR) 20 MG tablet Take 20 mg by mouth daily.     Calcium  Carb-Cholecalciferol (OYSTER SHELL CALCIUM  W/D) 500-5 MG-MCG TABS Take 1 tablet by mouth daily.     Cyanocobalamin  (PHYSICIANS EZ USE B-12) 1000 MCG/ML KIT Inject 1 kit into the muscle every 30 (thirty) days.     ferrous sulfate  325 (65 FE) MG tablet Take 1 tablet (325 mg total) daily with breakfast by mouth.  3   levothyroxine  (SYNTHROID , LEVOTHROID) 175 MCG tablet Take 175 mcg by mouth daily before breakfast.     Prenatal Vit-Fe Fumarate-FA (PRENATAL MULTIVITAMIN) TABS tablet Take 1 tablet by mouth daily at 12 noon.     No current facility-administered medications for this visit.     ASSESSMENT & PLAN:   Assessment:  Sarah Hess is a 43 y.o. female with history of iron  deficiency anemia. She has been using oral iron  supplement without success. She has had IV iron  in the past and is in agreement to this treatment plan again. She is preparing for hernia repair surgery and needs to have her iron  levels improved. She notes improvement in her symptoms. We will continue to monitor.    Plan: 1.  RTC in 3 months for follow up  I discussed the assessment and treatment plan with the patient.  The patient was provided an opportunity to ask questions and all were answered.  The patient agreed with the plan and demonstrated an understanding of the instructions.    Thank you for the referral    20 minutes was spent in patient care.  This included time spent preparing to see the patient (e.g., review of tests), obtaining and/or reviewing separately obtained history, counseling and educating the patient/family/caregiver, ordering medications, tests, or procedures; documenting clinical information in the electronic or other health record, independently interpreting results and communicating results to the patient/family/caregiver as well as coordination of care.      Eleanor DELENA Bach, NP   Family Nurse  Practitioner - Board Certified [facility] [phone]

## 2024-09-07 ENCOUNTER — Other Ambulatory Visit: Payer: Self-pay

## 2024-09-07 ENCOUNTER — Inpatient Hospital Stay (HOSPITAL_COMMUNITY)
Admission: EM | Admit: 2024-09-07 | Discharge: 2024-09-09 | DRG: 287 | Disposition: A | Discharge status: Home / Self Care | Attending: Cardiovascular Disease | Admitting: Cardiovascular Disease

## 2024-09-07 ENCOUNTER — Ambulatory Visit (INDEPENDENT_AMBULATORY_CARE_PROVIDER_SITE_OTHER): Admitting: Cardiology

## 2024-09-07 ENCOUNTER — Emergency Department (HOSPITAL_COMMUNITY)

## 2024-09-07 ENCOUNTER — Encounter (HOSPITAL_COMMUNITY): Payer: Self-pay | Admitting: Internal Medicine

## 2024-09-07 ENCOUNTER — Encounter: Payer: Self-pay | Admitting: Cardiology

## 2024-09-07 VITALS — BP 109/64 | HR 94 | Ht 71.0 in | Wt 250.0 lb

## 2024-09-07 DIAGNOSIS — Z59868 Other specified financial insecurity: Secondary | ICD-10-CM

## 2024-09-07 DIAGNOSIS — E785 Hyperlipidemia, unspecified: Secondary | ICD-10-CM | POA: Diagnosis present

## 2024-09-07 DIAGNOSIS — R079 Chest pain, unspecified: Secondary | ICD-10-CM | POA: Diagnosis not present

## 2024-09-07 DIAGNOSIS — Z9884 Bariatric surgery status: Secondary | ICD-10-CM | POA: Diagnosis not present

## 2024-09-07 DIAGNOSIS — I2510 Atherosclerotic heart disease of native coronary artery without angina pectoris: Secondary | ICD-10-CM | POA: Diagnosis present

## 2024-09-07 DIAGNOSIS — R0789 Other chest pain: Secondary | ICD-10-CM | POA: Diagnosis present

## 2024-09-07 DIAGNOSIS — R072 Precordial pain: Secondary | ICD-10-CM | POA: Diagnosis not present

## 2024-09-07 DIAGNOSIS — Z9049 Acquired absence of other specified parts of digestive tract: Secondary | ICD-10-CM

## 2024-09-07 DIAGNOSIS — E039 Hypothyroidism, unspecified: Secondary | ICD-10-CM | POA: Diagnosis present

## 2024-09-07 DIAGNOSIS — Z79899 Other long term (current) drug therapy: Secondary | ICD-10-CM | POA: Diagnosis not present

## 2024-09-07 DIAGNOSIS — Z79890 Hormone replacement therapy (postmenopausal): Secondary | ICD-10-CM

## 2024-09-07 DIAGNOSIS — Z8249 Family history of ischemic heart disease and other diseases of the circulatory system: Secondary | ICD-10-CM | POA: Diagnosis not present

## 2024-09-07 DIAGNOSIS — Z87891 Personal history of nicotine dependence: Secondary | ICD-10-CM

## 2024-09-07 DIAGNOSIS — I200 Unstable angina: Secondary | ICD-10-CM | POA: Diagnosis not present

## 2024-09-07 DIAGNOSIS — I25110 Atherosclerotic heart disease of native coronary artery with unstable angina pectoris: Secondary | ICD-10-CM | POA: Diagnosis not present

## 2024-09-07 DIAGNOSIS — Z833 Family history of diabetes mellitus: Secondary | ICD-10-CM | POA: Diagnosis not present

## 2024-09-07 DIAGNOSIS — I493 Ventricular premature depolarization: Secondary | ICD-10-CM | POA: Diagnosis present

## 2024-09-07 DIAGNOSIS — R002 Palpitations: Secondary | ICD-10-CM

## 2024-09-07 LAB — CBC WITH DIFFERENTIAL/PLATELET
Abs Immature Granulocytes: 0.01 K/uL (ref 0.00–0.07)
Basophils Absolute: 0 K/uL (ref 0.0–0.1)
Basophils Relative: 0 %
Eosinophils Absolute: 0.1 K/uL (ref 0.0–0.5)
Eosinophils Relative: 1 %
HCT: 41.8 % (ref 36.0–46.0)
Hemoglobin: 13.8 g/dL (ref 12.0–15.0)
Immature Granulocytes: 0 %
Lymphocytes Relative: 20 %
Lymphs Abs: 1.4 K/uL (ref 0.7–4.0)
MCH: 29 pg (ref 26.0–34.0)
MCHC: 33 g/dL (ref 30.0–36.0)
MCV: 87.8 fL (ref 80.0–100.0)
Monocytes Absolute: 0.8 K/uL (ref 0.1–1.0)
Monocytes Relative: 12 %
Neutro Abs: 4.6 K/uL (ref 1.7–7.7)
Neutrophils Relative %: 67 %
Platelets: 246 K/uL (ref 150–400)
RBC: 4.76 MIL/uL (ref 3.87–5.11)
RDW: 12.3 % (ref 11.5–15.5)
WBC: 6.9 K/uL (ref 4.0–10.5)
nRBC: 0 % (ref 0.0–0.2)

## 2024-09-07 LAB — MULTIPLE MYELOMA PANEL, SERUM
Albumin SerPl Elph-Mcnc: 3.8 g/dL (ref 2.9–4.4)
Albumin/Glob SerPl: 1.5 (ref 0.7–1.7)
Alpha 1: 0.2 g/dL (ref 0.0–0.4)
Alpha2 Glob SerPl Elph-Mcnc: 0.7 g/dL (ref 0.4–1.0)
B-Globulin SerPl Elph-Mcnc: 1.1 g/dL (ref 0.7–1.3)
Gamma Glob SerPl Elph-Mcnc: 0.8 g/dL (ref 0.4–1.8)
Globulin, Total: 2.7 g/dL (ref 2.2–3.9)
IgA: 228 mg/dL (ref 87–352)
IgG (Immunoglobin G), Serum: 1066 mg/dL (ref 586–1602)
IgM (Immunoglobulin M), Srm: 66 mg/dL (ref 26–217)
Total Protein ELP: 6.5 g/dL (ref 6.0–8.5)

## 2024-09-07 LAB — BASIC METABOLIC PANEL WITH GFR
Anion gap: 10 (ref 5–15)
BUN: 7 mg/dL (ref 6–20)
CO2: 24 mmol/L (ref 22–32)
Calcium: 8.9 mg/dL (ref 8.9–10.3)
Chloride: 105 mmol/L (ref 98–111)
Creatinine, Ser: 0.85 mg/dL (ref 0.44–1.00)
GFR, Estimated: >60 mL/min (ref >60)
Glucose, Bld: 96 mg/dL (ref 70–99)
Potassium: 3.5 mmol/L (ref 3.5–5.1)
Sodium: 139 mmol/L (ref 135–145)

## 2024-09-07 LAB — D-DIMER, QUANTITATIVE: D-Dimer, Quant: 0.39 ug{FEU}/mL (ref 0.00–0.50)

## 2024-09-07 LAB — HIV ANTIBODY (ROUTINE TESTING W REFLEX): HIV Screen 4th Generation wRfx: NONREACTIVE

## 2024-09-07 LAB — HCG, SERUM, QUALITATIVE: Preg, Serum: NEGATIVE

## 2024-09-07 LAB — TROPONIN I (HIGH SENSITIVITY)
Troponin I (High Sensitivity): 7 ng/L (ref <18)
Troponin I (High Sensitivity): 7 ng/L (ref <18)

## 2024-09-07 MED ORDER — FERROUS SULFATE 325 (65 FE) MG PO TABS
325.0000 mg | ORAL_TABLET | Freq: Every day | ORAL | Status: DC
  Administered 2024-09-09: 325 mg via ORAL
  Filled 2024-09-07 (×2): qty 1

## 2024-09-07 MED ORDER — LEVOTHYROXINE SODIUM 75 MCG PO TABS
175.0000 ug | ORAL_TABLET | Freq: Every day | ORAL | Status: DC
  Administered 2024-09-09: 175 ug via ORAL
  Filled 2024-09-07 (×2): qty 1

## 2024-09-07 MED ORDER — NITROGLYCERIN 0.4 MG SL SUBL: 0.4000 mg | SUBLINGUAL_TABLET | SUBLINGUAL | Status: DC | PRN

## 2024-09-07 MED ORDER — ASPIRIN 81 MG PO CHEW
324.0000 mg | CHEWABLE_TABLET | ORAL | Status: AC
  Administered 2024-09-08: 324 mg via ORAL
  Filled 2024-09-07: qty 4

## 2024-09-07 MED ORDER — PRENATAL MULTIVITAMIN CH
1.0000 | ORAL_TABLET | Freq: Every day | ORAL | Status: DC
  Administered 2024-09-09: 1 via ORAL
  Filled 2024-09-07 (×2): qty 1

## 2024-09-07 MED ORDER — ATORVASTATIN CALCIUM 80 MG PO TABS
80.0000 mg | ORAL_TABLET | Freq: Every day | ORAL | Status: DC
  Administered 2024-09-07 – 2024-09-08 (×2): 80 mg via ORAL
  Filled 2024-09-07: qty 1
  Filled 2024-09-07: qty 2

## 2024-09-07 MED ORDER — ASPIRIN 81 MG PO TBEC
81.0000 mg | DELAYED_RELEASE_TABLET | Freq: Every day | ORAL | Status: DC
  Administered 2024-09-09: 81 mg via ORAL
  Filled 2024-09-07: qty 1

## 2024-09-07 MED ORDER — PSYLLIUM 95 % PO PACK
1.0000 | PACK | Freq: Every day | ORAL | Status: DC
  Administered 2024-09-09: 1 via ORAL
  Filled 2024-09-07 (×2): qty 1

## 2024-09-07 MED ORDER — ACETAMINOPHEN 325 MG PO TABS
650.0000 mg | ORAL_TABLET | ORAL | Status: DC | PRN
  Administered 2024-09-08 – 2024-09-09 (×2): 650 mg via ORAL
  Filled 2024-09-07 (×2): qty 2

## 2024-09-07 MED ORDER — ONDANSETRON HCL 4 MG/2ML IJ SOLN: 4.0000 mg | Freq: Four times a day (QID) | INTRAMUSCULAR | Status: DC | PRN

## 2024-09-07 MED ORDER — ASPIRIN 300 MG RE SUPP: 300.0000 mg | RECTAL | Status: AC

## 2024-09-07 NOTE — H&P (Signed)
 Cardiology Admission History and Physical   Patient ID: Sarah Hess MRN: [id number]; DOB: [birth year]   Admission date: 09/07/2024  PCP:  Hess Sarah LABOR., MD   [facility] Providers Cardiologist:  None       Chief Complaint:  chest pain  Patient Profile: Sarah Hess is a 43 y.o. female with a history of hypothyroidism who is being seen 09/07/2024 for the evaluation of chest pain.  History of Present Illness:  HPI per Dr. Pietro:  HPI: 43 yo female for evaluation of palpitations and chest pain at request of Sarah Thurmond MD. Previously followed by Dr Sarah Hess at [facility] but transitioning to me. Apparently had abnormal Ca score 2015; full report not available. Echo 7/23 at [facility] with normal LV function. Monitor 7/23 at [facility] report not available but no significant arrhythmias per pt. Labs 8/25 showed K 4, normal TSH and Hgb.  Patient states that over the past 2 months she has had occasional chest pressure.  It is in the substernal area without radiation.  There is some nausea but no diaphoresis or dyspnea.  Last 10 to 20 minutes and resolve spontaneously.  Not pleuritic, related to food and not clearly exertional.  Most recent episode was this morning for 10 to 15 minutes.  Presently pain-free.  Also notes occasional palpitations described as a skipped.  She has recorded some strips that show sinus with PVCs.  No syncope or dyspnea.     Past Medical History:  Diagnosis Date   Anemia    Hyperlipidemia    Hypothyroid    Past Surgical History:  Procedure Laterality Date   ACHILLES TENDON LENGTHENING     BREAST REDUCTION SURGERY     CESAREAN SECTION N/A 08/29/2017   Procedure: CESAREAN SECTION;  Surgeon: Sarah Ade, MD;  Location: [facility];  Service: Obstetrics;  Laterality: N/A;   CESAREAN SECTION     CHOLECYSTECTOMY     LAPAROSCOPIC GASTRIC BYPASS     ORIF FEMORAL SHAFT FRACTURE W/ PLATES AND SCREWS     Multiple surgeries     Medications Prior  to Admission: Prior to Admission medications   Medication Sig Start Date End Date Taking? Authorizing Provider  atorvastatin (LIPITOR) 20 MG tablet Take 20 mg by mouth daily. 08/14/22  Yes [provider]  Calcium  Carb-Cholecalciferol (OYSTER SHELL CALCIUM  W/D) 500-5 MG-MCG TABS Take 1 tablet by mouth daily. 02/05/16  Yes [provider]  Cyanocobalamin  (PHYSICIANS EZ USE B-12) 1000 MCG/ML KIT Inject 1 kit into the muscle every 30 (thirty) days. 08/14/22  Yes [provider]  ferrous sulfate  325 (65 FE) MG tablet Take 1 tablet (325 mg total) daily with breakfast by mouth. 09/02/17  Yes Paul, Daniela C, CNM  levothyroxine  (SYNTHROID ) 25 MCG tablet Take 25 mcg by mouth every evening.   Yes [provider]  Prenatal Vit-Fe Fumarate-FA (PRENATAL MULTIVITAMIN) TABS tablet Take 1 tablet by mouth daily.   Yes [provider]  psyllium (REGULOID) 0.52 g capsule Take 4 capsules by mouth daily.   Yes [provider]     Allergies:    Allergies  Allergen Reactions   Pineapple Anaphylaxis    Only while pregnant, able to eat it now.   Wound Dressing Adhesive Itching    Tegaderm, cardiac monitor patches Redness in placement area and skin break down when left on too long    Social History:   Social History   Socioeconomic History   Marital status: Married    Spouse name: Not  on file   Number of children: 2   Years of education: Not on file   Highest education level: Not on file  Occupational History   Not on file  Tobacco Use   Smoking status: Former    Types: Cigarettes   Smokeless tobacco: Never  Vaping Use   Vaping status: Never Used  Substance and Sexual Activity   Alcohol use: Yes    Comment: once a month   Drug use: Not Currently    Comment: [place] on occasional   Sexual activity: Not on file  Other Topics Concern   Not on file  Social History Narrative   Not on file   Social Drivers of Health   Financial Resource Strain:  Medium Risk (06/15/2021)   Received from [facility] visits prior to 12/29/2022.   Overall Financial Resource Strain (CARDIA)    Difficulty of Paying Living Expenses: Somewhat hard  Food Insecurity: Low Risk  (03/29/2024)   Received from [facility]   Hunger Vital Sign    Within the past 12 months, you worried that your food would run out before you got money to buy more: Never true    Within the past 12 months, the food you bought just didn't last and you didn't have money to get more. : Never true  Transportation Needs: No Transportation Needs (03/29/2024)   Received from [facility]    In the past 12 months, has lack of reliable transportation kept you from medical appointments, meetings, work or from getting things needed for daily living? : No  Physical Activity: Insufficiently Active (06/15/2021)   Received from [facility] visits prior to 12/29/2022.   Exercise Vital Sign    On average, how many days per week do you engage in moderate to strenuous exercise (like a brisk walk)?: 1 day    On average, how many minutes do you engage in exercise at this level?: 20 min  Stress: No Stress Concern Present (06/15/2021)   [facility] visits prior to 12/29/2022.   [employer] - Occupational Stress Questionnaire    Feeling of Stress : Only a little  Social Connections: Moderately Isolated (06/15/2021)   Received from [facility] visits prior to 12/29/2022.   Social Connection and Isolation Panel    In a typical week, how many times do you talk on the phone with family, friends, or neighbors?: More than three times a week    How often do you get together with friends or relatives?: Once a week    How often do you attend church or religious services?: Never    Do you belong to any clubs or organizations such as church groups, unions, fraternal or athletic  groups, or school groups?: No    How often do you attend meetings of the clubs or organizations you belong to?: Never    Are you married, widowed, divorced, separated, never married, or living with a partner?: Married  Intimate Partner Violence: Not At Risk (06/15/2021)   [facility] from [facility] visits prior to 12/29/2022.   Humiliation, Afraid, Rape, and Kick questionnaire    Within the last year, have you been afraid of your partner or ex-partner?: No    Within the last year, have you been humiliated or emotionally abused in other ways by your partner or ex-partner?: No    Within the last year, have  you been kicked, hit, slapped, or otherwise physically hurt by your partner or ex-partner?: No    Within the last year, have you been raped or forced to have any kind of sexual activity by your partner or ex-partner?: No     Family History:   The patient's family history includes Alzheimer's disease in her paternal grandmother; Breast cancer in her mother, sister, and sister; Cancer in her paternal uncle; Colon cancer in her maternal grandfather; Dementia in her paternal grandmother; Diabetes in her sister; Heart attack in her father and paternal grandfather; Heart disease in her father, maternal grandfather, maternal grandmother, and paternal grandmother; Ovarian cancer in her paternal grandmother; Prostate cancer in her maternal grandfather; Uterine cancer in her mother.    ROS:  Please see the history of present illness.  All other ROS reviewed and negative.     Physical Exam/Data: Vitals:   09/07/24 1606 09/07/24 2017  BP: 136/79 125/69  Pulse: 99 77  Resp: 18 16  Temp: 98.2 F (36.8 C) 98.7 F (37.1 C)  TempSrc:  Oral  SpO2: 99% 98%   No intake or output data in the 24 hours ending 09/07/24 2156    09/07/2024    2:38 PM 09/01/2024    2:57 PM 06/01/2024    3:04 PM  Last 3 Weights  Weight (lbs) 250 lb 249 lb 14.4 oz 255 lb  Weight (kg) 113.399 kg 113.354 kg  115.667 kg     There is no height or weight on file to calculate BMI.  General:  Well nourished, well developed, in no acute distress HEENT: normal Neck: no JVD Vascular: No carotid bruits; Distal pulses 2+ bilaterally   Cardiac:  normal S1, S2; RRR; no murmur  Lungs:  clear to auscultation bilaterally, no wheezing, rhonchi or rales  Abd: soft, nontender, no hepatomegaly  Ext: no edema Musculoskeletal:  No deformities, BUE and BLE strength normal and equal Skin: warm and dry  Neuro:  CNs 2-12 intact, no focal abnormalities noted Psych:  Normal affect   EKG:  The ECG that was done  was personally reviewed and demonstrates sinus rhythm  Relevant CV Studies: reviewed  Laboratory Data: High Sensitivity Troponin:   Recent Labs  Lab 09/07/24 1613 09/07/24 1915  TROPONINIHS 7 7      Chemistry Recent Labs  Lab 09/01/24 1429 09/07/24 1613  NA 139 139  K 4.1 3.5  CL 104 105  CO2 24 24  GLUCOSE 96 96  BUN 8 7  CREATININE 0.72 0.85  CALCIUM  9.7 8.9  GFRNONAA >60 >60  ANIONGAP 12 10    Recent Labs  Lab 09/01/24 1429  PROT 7.1  ALBUMIN 4.4  AST 23  ALT 23  ALKPHOS 83  BILITOT 0.8   Lipids No results for input(s): CHOL, TRIG, HDL, LABVLDL, LDLCALC, CHOLHDL in the last 168 hours. Hematology Recent Labs  Lab 09/01/24 1429 09/07/24 1613  WBC 7.8 6.9  RBC 4.86 4.76  HGB 14.1 13.8  HCT 41.5 41.8  MCV 85.4 87.8  MCH 29.0 29.0  MCHC 34.0 33.0  RDW 12.3 12.3  PLT 272 246   Thyroid   Recent Labs  Lab 09/01/24 1428  TSH 4.060   BNPNo results for input(s): BNP, PROBNP in the last 168 hours.  DDimer  Recent Labs  Lab 09/07/24 1613  DDIMER 0.39    Radiology/Studies:  DG Chest 2 View Result Date: 09/07/2024 EXAM: 2 VIEW(S) XRAY OF THE CHEST 09/07/2024 04:34:00 PM COMPARISON: None available. CLINICAL HISTORY: cp FINDINGS:  LUNGS AND PLEURA: No focal pulmonary opacity. No pulmonary edema. No pleural effusion. No pneumothorax. HEART AND  MEDIASTINUM: The cardiac silhouette, mediastinal, and hilar contours are normal and stable. BONES AND SOFT TISSUES: No acute osseous abnormality. IMPRESSION: 1. No acute cardiopulmonary process. Electronically signed by: Maude Stammer MD 09/07/2024 04:59 PM EST RP Workstation: [id number]     Assessment and Plan:  Shefali Ng is a 43 y.o. female with a history of hypothyroidism who is being seen 09/07/2024 for the evaluation of chest pain.    1 chest pain-She is doing well.  Her chest pain is intermittent.  She has no evidence of mild acute myocardial injury at this time.  symptoms with both typical and atypical features.  However her electrocardiogram shows lateral T wave inversion which is new compared to previous.  Strong family history. Continue plan per Dr. Pietro. -start IV heparin -ASA load + maintenance -NPO at [place] for LHC -increase atorvastatin to 80mg  qdaily   2 Coronary calcification-apparently noted on previous calcium  score.  Add aspirin 81 mg daily and continue statin.   3 Hyperlipidemia-increase Lipitor to 80 mg daily.  Check lipids and liver in 8 weeks.   4 palpitations-she did record strips and is noted to have PVCs.  5. Hypothyroism: restart home synthroid   Risk Assessment/Risk Scores:   TIMI Risk Score for Unstable Angina or Non-ST Elevation MI:   The patient's TIMI risk score is  , which indicates a  % risk of all cause mortality, new or recurrent myocardial infarction or need for urgent revascularization in the next 14 days.     Code Status: Full Code  Severity of Illness: The appropriate patient status for this patient is INPATIENT. Inpatient status is judged to be reasonable and necessary in order to provide the required intensity of service to ensure the patient's safety. The patient's presenting symptoms, physical exam findings, and initial radiographic and laboratory data in the context of their chronic comorbidities is felt to place them at high risk  for further clinical deterioration. Furthermore, it is not anticipated that the patient will be medically stable for discharge from the hospital within 2 midnights of admission.   * I certify that at the point of admission it is my clinical judgment that the patient will require inpatient hospital care spanning beyond 2 midnights from the point of admission due to high intensity of service, high risk for further deterioration and high frequency of surveillance required.*  For questions or updates, please contact [facility] Please consult [email or web address] for contact info under       Signed, Sarah Morford A Destiney Sanabia, MD  09/07/2024 9:56 PM

## 2024-09-07 NOTE — ED Triage Notes (Signed)
 Pt referred to ED from cardiology office for intermittent CP over the last 2 months. Pt states that pain lasts approximately 10-20 min at a time, none currently. Recent travel yesterday from [place]. NAD noted during triage.

## 2024-09-07 NOTE — ED Triage Notes (Signed)
 First Nurse Note: Pt sent by cards for admission and to have a scheduled heart cath tomorrow. Pt has had CP for about 2 months and she was told she has EKG changes. NAD on arrival.

## 2024-09-07 NOTE — ED Provider Notes (Signed)
 [place] EMERGENCY DEPARTMENT AT [facility] Provider Note   CSN: [id number] Arrival date & time: 09/07/24  1603    Patient presents with: Chest Pain   Sarah Hess is a 43 y.o. female for evaluation of pain.  Has had intermittent chest pain over the last 2 months.  Exertional in nature typically however today had an episode of nonexertional chest pain.  Was seen by cardiology was concern for unstable angina.  There is plan for admission.  Patient does note she has had some palpitations.  She did recently have plane travel on an airplane, returned yesterday.  Patient states she feels like she cannot take a deep breath however denies any overt shortness of breath.  No pain or swelling to legs.  No history of PE or DVT.  No back pain, abdominal pain.  Per cardiology note plan to admit    HPI     Prior to Admission medications   Medication Sig Start Date End Date Taking? Authorizing Provider  atorvastatin (LIPITOR) 20 MG tablet Take 20 mg by mouth daily. 08/14/22  Yes [provider]  Calcium  Carb-Cholecalciferol (OYSTER SHELL CALCIUM  W/D) 500-5 MG-MCG TABS Take 1 tablet by mouth daily. 02/05/16  Yes [provider]  Cyanocobalamin  (PHYSICIANS EZ USE B-12) 1000 MCG/ML KIT Inject 1 kit into the muscle every 30 (thirty) days. 08/14/22  Yes [provider]  ferrous sulfate  325 (65 FE) MG tablet Take 1 tablet (325 mg total) daily with breakfast by mouth. 09/02/17  Yes Paul, Daniela C, CNM  levothyroxine  (SYNTHROID ) 25 MCG tablet Take 25 mcg by mouth every evening.   Yes [provider]  Prenatal Vit-Fe Fumarate-FA (PRENATAL MULTIVITAMIN) TABS tablet Take 1 tablet by mouth daily.   Yes [provider]  psyllium (REGULOID) 0.52 g capsule Take 4 capsules by mouth daily.   Yes [provider]    Allergies: Pineapple and Wound dressing adhesive    Review of Systems  Constitutional: Negative.   HENT: Negative.    Respiratory:   Positive for chest tightness and shortness of breath. Negative for cough, choking, wheezing and stridor.   Cardiovascular:  Positive for chest pain. Negative for palpitations and leg swelling.  Gastrointestinal: Negative.   Genitourinary: Negative.   Musculoskeletal: Negative.   Skin: Negative.   Neurological: Negative.   All other systems reviewed and are negative.   Updated Vital Signs BP 125/69   Pulse 77   Temp 98.7 F (37.1 C) (Oral)   Resp 16   LMP 08/28/2024   SpO2 98%   Physical Exam Vitals and nursing note reviewed.  Constitutional:      General: She is not in acute distress.    Appearance: She is well-developed. She is not ill-appearing, toxic-appearing or diaphoretic.  HENT:     Head: Atraumatic.  Eyes:     Pupils: Pupils are equal, round, and reactive to light.  Cardiovascular:     Rate and Rhythm: Normal rate.     Pulses:          Radial pulses are 2+ on the right side and 2+ on the left side.       Dorsalis pedis pulses are 2+ on the right side and 2+ on the left side.     Heart sounds: Normal heart sounds.  Pulmonary:     Effort: Pulmonary effort is normal. No respiratory distress.     Breath sounds: Normal breath sounds.  Abdominal:     General: Bowel sounds are normal.  There is no distension.     Palpations: Abdomen is soft.  Musculoskeletal:        General: Normal range of motion.     Cervical back: Normal range of motion.     Right lower leg: No tenderness. No edema.     Left lower leg: No tenderness. No edema.  Skin:    General: Skin is warm and dry.     Capillary Refill: Capillary refill takes less than 2 seconds.  Neurological:     General: No focal deficit present.     Mental Status: She is alert.  Psychiatric:        Mood and Affect: Mood normal.     (all labs ordered are listed, but only abnormal results are displayed) Labs Reviewed  CBC WITH DIFFERENTIAL/PLATELET  BASIC METABOLIC PANEL WITH GFR  HCG, SERUM, QUALITATIVE  D-DIMER,  QUANTITATIVE  HIV ANTIBODY (ROUTINE TESTING W REFLEX)  LIPOPROTEIN A (LPA)  CBC  BASIC METABOLIC PANEL WITH GFR  TROPONIN I (HIGH SENSITIVITY)  TROPONIN I (HIGH SENSITIVITY)    EKG: None  Radiology: DG Chest 2 View Result Date: 09/07/2024 EXAM: 2 VIEW(S) XRAY OF THE CHEST 09/07/2024 04:34:00 PM COMPARISON: None available. CLINICAL HISTORY: cp FINDINGS: LUNGS AND PLEURA: No focal pulmonary opacity. No pulmonary edema. No pleural effusion. No pneumothorax. HEART AND MEDIASTINUM: The cardiac silhouette, mediastinal, and hilar contours are normal and stable. BONES AND SOFT TISSUES: No acute osseous abnormality. IMPRESSION: 1. No acute cardiopulmonary process. Electronically signed by: Maude Stammer MD 09/07/2024 04:59 PM EST RP Workstation: [id number]     Procedures   Medications Ordered in the ED  aspirin chewable tablet 324 mg (has no administration in time range)    Or  aspirin suppository 300 mg (has no administration in time range)  aspirin EC tablet 81 mg (has no administration in time range)  nitroGLYCERIN (NITROSTAT) SL tablet 0.4 mg (has no administration in time range)  acetaminophen  (TYLENOL ) tablet 650 mg (has no administration in time range)  ondansetron  (ZOFRAN ) injection 4 mg (has no administration in time range)    43 year old here for evaluation of chest pain.  She has had exertional as well as today having nonexertional chest pain.  She is currently chest pain-free.  EKG at cardiology office showed some T wave inversion.  She did mention to me she had recent airplane travel got back yesterday.  She has had some palpitations.  Will add on a D-dimer.  Per cardiology note plan for admission.  Labs and imaging personally viewed and interpreted:  EKG wo ischemic changes Chest x-ray without cardiomegaly, pulm edema, pneumothorax, infiltrates CBC without leukocytosis Metabolic panel without significant abnormality Troponin 7 D-dimer 0.39 Preg neg  Patient  reassessed.  Patient to be admitted to Cardiology service. CP free currently.  The patient appears reasonably stabilized for admission considering the current resources, flow, and capabilities available in the ED at this time, and I doubt any other [facility] requiring further screening and/or treatment in the ED prior to admission.                                    Medical Decision Making Amount and/or Complexity of Data Reviewed External Data Reviewed: labs, radiology, ECG and notes. Labs: ordered. Decision-making details documented in ED Course. Radiology: ordered and independent interpretation performed. Decision-making details documented in ED Course. ECG/medicine tests: ordered and independent interpretation performed. Decision-making details documented in ED  Course.  Risk OTC drugs. Prescription drug management. Parenteral controlled substances. Decision regarding hospitalization. Diagnosis or treatment significantly limited by social determinants of health.       Final diagnoses:  Precordial pain    ED Discharge Orders     None          Mead Slane A, PA-C 09/07/24 2148    Elnor Bernarda SQUIBB, DO 09/07/24 2217

## 2024-09-07 NOTE — ED Provider Triage Note (Signed)
 Emergency Medicine Provider Triage Evaluation Note  Sarah Hess , a 43 y.o. female  was evaluated in triage.  Pt complains of chest pain.  Has had some intermittent chest pain over the last 2 months.  Exertional however had an episode this morning at rest.  Was seen by cardiology who was concern for unstable angina, plan for admission.  Patient does note she has had some palpitations, she did recently travel on an airplane and returned yesterday.  States sometimes she feels like she cannot take a deep breath however denies any overt shortness of breath.  Denies any pain or swelling to her legs.  No history of PE, DVT.  Plan per cardiology note was to admit.  Will add on D-dimer given recent travel, palpitations  Review of Systems  Positive: CP, palpitations Negative:   Physical Exam  BP 136/79   Pulse 99   Temp 98.2 F (36.8 C)   Resp 18   LMP 08/28/2024   SpO2 99%  Gen:   Awake, no distress   Resp:  Normal effort  MSK:   Moves extremities without difficulty  Other:    Medical Decision Making  Medically screening exam initiated at 4:10 PM.  Appropriate orders placed.  Sarah Hess was informed that the remainder of the evaluation will be completed by another provider, this initial triage assessment does not replace that evaluation, and the importance of remaining in the ED until their evaluation is complete.  CP   Sarah Hess A, PA-C 09/07/24 1611

## 2024-09-08 ENCOUNTER — Encounter (HOSPITAL_COMMUNITY): Payer: Self-pay | Admitting: Internal Medicine

## 2024-09-08 ENCOUNTER — Encounter (HOSPITAL_COMMUNITY)
Admission: EM | Disposition: A | Discharge status: Home / Self Care | Payer: Self-pay | Source: Ambulatory Visit | Attending: Internal Medicine

## 2024-09-08 DIAGNOSIS — I25110 Atherosclerotic heart disease of native coronary artery with unstable angina pectoris: Secondary | ICD-10-CM | POA: Diagnosis not present

## 2024-09-08 DIAGNOSIS — I200 Unstable angina: Secondary | ICD-10-CM

## 2024-09-08 HISTORY — PX: LEFT HEART CATH AND CORONARY ANGIOGRAPHY: CATH118249

## 2024-09-08 LAB — BASIC METABOLIC PANEL WITH GFR
Anion gap: 11 (ref 5–15)
BUN: 7 mg/dL (ref 6–20)
CO2: 23 mmol/L (ref 22–32)
Calcium: 8.7 mg/dL — ABNORMAL LOW (ref 8.9–10.3)
Chloride: 108 mmol/L (ref 98–111)
Creatinine, Ser: 0.78 mg/dL (ref 0.44–1.00)
GFR, Estimated: >60 mL/min (ref >60)
Glucose, Bld: 89 mg/dL (ref 70–99)
Potassium: 3.6 mmol/L (ref 3.5–5.1)
Sodium: 142 mmol/L (ref 135–145)

## 2024-09-08 LAB — CBC
HCT: 37.1 % (ref 36.0–46.0)
Hemoglobin: 12.4 g/dL (ref 12.0–15.0)
MCH: 29.1 pg (ref 26.0–34.0)
MCHC: 33.4 g/dL (ref 30.0–36.0)
MCV: 87.1 fL (ref 80.0–100.0)
Platelets: 219 K/uL (ref 150–400)
RBC: 4.26 MIL/uL (ref 3.87–5.11)
RDW: 12.4 % (ref 11.5–15.5)
WBC: 5.4 K/uL (ref 4.0–10.5)
nRBC: 0 % (ref 0.0–0.2)

## 2024-09-08 MED ORDER — HEPARIN (PORCINE) IN NACL 1000-0.9 UT/500ML-% IV SOLN
INTRAVENOUS | Status: DC | PRN
  Administered 2024-09-08 (×2): 500 mL

## 2024-09-08 MED ORDER — HEPARIN SODIUM (PORCINE) 1000 UNIT/ML IJ SOLN
INTRAMUSCULAR | Status: AC
  Filled 2024-09-08: qty 10

## 2024-09-08 MED ORDER — VERAPAMIL HCL 2.5 MG/ML IV SOLN
INTRAVENOUS | Status: DC | PRN
  Administered 2024-09-08: 10 mL via INTRA_ARTERIAL

## 2024-09-08 MED ORDER — FREE WATER: 500.0000 mL | Freq: Once | Status: DC

## 2024-09-08 MED ORDER — SODIUM CHLORIDE 0.9% FLUSH: 3.0000 mL | INTRAVENOUS | Status: DC | PRN

## 2024-09-08 MED ORDER — LABETALOL HCL 5 MG/ML IV SOLN: 10.0000 mg | INTRAVENOUS | Status: AC | PRN

## 2024-09-08 MED ORDER — LIDOCAINE HCL (PF) 1 % IJ SOLN
INTRAMUSCULAR | Status: DC | PRN
  Administered 2024-09-08: 5 mL
  Administered 2024-09-08: 2 mL

## 2024-09-08 MED ORDER — FENTANYL CITRATE (PF) 100 MCG/2ML IJ SOLN
INTRAMUSCULAR | Status: AC
  Filled 2024-09-08: qty 2

## 2024-09-08 MED ORDER — HEPARIN SODIUM (PORCINE) 1000 UNIT/ML IJ SOLN
INTRAMUSCULAR | Status: DC | PRN
  Administered 2024-09-08: 5000 [IU] via INTRAVENOUS

## 2024-09-08 MED ORDER — SODIUM CHLORIDE 0.9% FLUSH
3.0000 mL | Freq: Two times a day (BID) | INTRAVENOUS | Status: DC
  Administered 2024-09-08 – 2024-09-09 (×2): 3 mL via INTRAVENOUS

## 2024-09-08 MED ORDER — MIDAZOLAM HCL (PF) 2 MG/2ML IJ SOLN
INTRAMUSCULAR | Status: DC | PRN
  Administered 2024-09-08: 2 mg via INTRAVENOUS
  Administered 2024-09-08: 1 mg via INTRAVENOUS

## 2024-09-08 MED ORDER — FREE WATER
500.0000 mL | Freq: Once | Status: AC
Start: 2024-09-08 — End: 2024-09-08
  Administered 2024-09-08: 500 mL via ORAL

## 2024-09-08 MED ORDER — FENTANYL CITRATE (PF) 100 MCG/2ML IJ SOLN
INTRAMUSCULAR | Status: DC | PRN
  Administered 2024-09-08 (×2): 25 ug via INTRAVENOUS

## 2024-09-08 MED ORDER — LIDOCAINE HCL (PF) 1 % IJ SOLN
INTRAMUSCULAR | Status: AC
Start: 2024-09-08 — End: 2024-09-08
  Filled 2024-09-08: qty 30

## 2024-09-08 MED ORDER — ASPIRIN 81 MG PO CHEW
CHEWABLE_TABLET | ORAL | Status: AC
  Filled 2024-09-08: qty 1

## 2024-09-08 MED ORDER — MIDAZOLAM HCL 2 MG/2ML IJ SOLN
INTRAMUSCULAR | Status: AC
  Filled 2024-09-08: qty 2

## 2024-09-08 MED ORDER — ASPIRIN 81 MG PO CHEW
CHEWABLE_TABLET | ORAL | Status: DC | PRN
  Administered 2024-09-08: 81 mg via ORAL

## 2024-09-08 MED ORDER — VERAPAMIL HCL 2.5 MG/ML IV SOLN
INTRAVENOUS | Status: AC
  Filled 2024-09-08: qty 2

## 2024-09-08 MED ORDER — HYDRALAZINE HCL 20 MG/ML IJ SOLN: 10.0000 mg | INTRAMUSCULAR | Status: AC | PRN

## 2024-09-08 MED ORDER — SODIUM CHLORIDE 0.9 % IV SOLN: 250.0000 mL | INTRAVENOUS | Status: DC | PRN

## 2024-09-08 NOTE — Interval H&P Note (Signed)
 History and Physical Interval Note:  09/08/2024 3:42 PM  Edsel Mura  has presented today for surgery, with the diagnosis of unstable angina.  The various methods of treatment have been discussed with the patient and family. After consideration of risks, benefits and other options for treatment, the patient has consented to  Procedure(s): LEFT HEART CATH AND CORONARY ANGIOGRAPHY (N/A)  PERCUTANEOUS CORONARY INTERVENTION  as a surgical intervention.  The patient's history has been reviewed, patient examined, no change in status, stable for surgery.  I have reviewed the patient's chart and labs.  Questions were answered to the patient's satisfaction.    Cath Lab Visit (complete for each Cath Lab visit)  Clinical Evaluation Leading to the Procedure:   ACS: Yes.    Non-ACS:    Anginal Classification: CCS IV  Anti-ischemic medical therapy: Minimal Therapy (1 class of medications)  Non-Invasive Test Results: No non-invasive testing performed  Prior CABG: No previous CABG     Alm Clay

## 2024-09-08 NOTE — Progress Notes (Signed)
 Rounding Note   Patient Name: Sarah Hess Date of Encounter: 09/08/2024  [facility] Cardiologist: Pietro  Subjective  No chest pain  Scheduled Meds:  aspirin EC  81 mg Oral Daily   atorvastatin  80 mg Oral QHS   ferrous sulfate   325 mg Oral Q breakfast   free water  500 mL Oral Once   levothyroxine   175 mcg Oral QAC breakfast   prenatal multivitamin  1 tablet Oral Q1200   psyllium  1 packet Oral Daily   Continuous Infusions:  PRN Meds: acetaminophen , nitroGLYCERIN, ondansetron  (ZOFRAN ) IV   Vital Signs  Vitals:   09/08/24 0530 09/08/24 0615 09/08/24 0622 09/08/24 0705  BP: 104/65 (!) 103/53    Pulse: 69 67    Resp: 15 12    Temp:   98.4 F (36.9 C)   TempSrc:   Oral   SpO2: 100% 98%  98%  Weight:      Height:       No intake or output data in the 24 hours ending 09/08/24 0913    09/07/2024   11:30 PM 09/07/2024    2:38 PM 09/01/2024    2:57 PM  Last 3 Weights  Weight (lbs) 250 lb 250 lb 249 lb 14.4 oz  Weight (kg) 113.399 kg 113.399 kg 113.354 kg      Telemetry Sinus - Personally Reviewed  ECG  No AM tracing - Personally Reviewed  Physical Exam  GEN: No acute distress.   Neck: No JVD Cardiac: RRR, no murmurs, rubs, or gallops.  Respiratory: Clear to auscultation bilaterally. GI: Soft, nontender, non-distended  MS: No edema; No deformity. Neuro:  Nonfocal  Psych: Normal affect   Labs High Sensitivity Troponin:   Recent Labs  Lab 09/07/24 1613 09/07/24 1915  TROPONINIHS 7 7     Chemistry Recent Labs  Lab 09/01/24 1429 09/07/24 1613 09/08/24 0341  NA 139 139 142  K 4.1 3.5 3.6  CL 104 105 108  CO2 24 24 23   GLUCOSE 96 96 89  BUN 8 7 7   CREATININE 0.72 0.85 0.78  CALCIUM  9.7 8.9 8.7*  PROT 7.1  --   --   ALBUMIN 4.4  --   --   AST 23  --   --   ALT 23  --   --   ALKPHOS 83  --   --   BILITOT 0.8  --   --   GFRNONAA >60 >60 >60  ANIONGAP 12 10 11     Lipids No results for input(s): CHOL, TRIG, HDL,  LABVLDL, LDLCALC, CHOLHDL in the last 168 hours.  Hematology Recent Labs  Lab 09/01/24 1429 09/07/24 1613 09/08/24 0341  WBC 7.8 6.9 5.4  RBC 4.86 4.76 4.26  HGB 14.1 13.8 12.4  HCT 41.5 41.8 37.1  MCV 85.4 87.8 87.1  MCH 29.0 29.0 29.1  MCHC 34.0 33.0 33.4  RDW 12.3 12.3 12.4  PLT 272 246 219   Thyroid   Recent Labs  Lab 09/01/24 1428  TSH 4.060    BNPNo results for input(s): BNP, PROBNP in the last 168 hours.  DDimer  Recent Labs  Lab 09/07/24 1613  DDIMER 0.39     Radiology  DG Chest 2 View Result Date: 09/07/2024 EXAM: 2 VIEW(S) XRAY OF THE CHEST 09/07/2024 04:34:00 PM COMPARISON: None available. CLINICAL HISTORY: cp FINDINGS: LUNGS AND PLEURA: No focal pulmonary opacity. No pulmonary edema. No pleural effusion. No pneumothorax. HEART AND MEDIASTINUM: The cardiac silhouette, mediastinal, and hilar contours are normal  and stable. BONES AND SOFT TISSUES: No acute osseous abnormality. IMPRESSION: 1. No acute cardiopulmonary process. Electronically signed by: Maude Stammer MD 09/07/2024 04:59 PM EST RP Workstation: [id number]    Patient Profile   43 y.o. female with history of hypothyroidism admitted with chest pain. She was seen in our office yesterday by Dr. Pietro and given her chest pain and EKG changes was admitted. Troponin negative.   Assessment & Plan   Unstable angina: Concern for unstable angina with symptoms and EKG changes. Continue ASA and statin. Plan for cardiac cath today with possible PCI  I have reviewed the risks, indications, and alternatives to cardiac catheterization, possible angioplasty, and stenting with the patient. Risks include but are not limited to bleeding, infection, vascular injury, stroke, myocardial infection, arrhythmia, kidney injury, radiation-related injury in the case of prolonged fluoroscopy use, emergency cardiac surgery, and death. The patient understands the risks of serious complication is 1-2 in 1000 with  diagnostic cardiac cath and 1-2% or less with angioplasty/stenting.  HLD: Continue statin. Lipitor has been increased.   For questions or updates, please contact [facility] Please consult [email or web address] for contact info under    Signed, Lonni Cash, MD  09/08/2024, 9:13 AM

## 2024-09-08 NOTE — H&P (View-Only) (Signed)
 Rounding Note   Patient Name: Sarah Hess Date of Encounter: 09/08/2024  [facility] Cardiologist: Pietro  Subjective  No chest pain  Scheduled Meds:  aspirin EC  81 mg Oral Daily   atorvastatin  80 mg Oral QHS   ferrous sulfate   325 mg Oral Q breakfast   free water  500 mL Oral Once   levothyroxine   175 mcg Oral QAC breakfast   prenatal multivitamin  1 tablet Oral Q1200   psyllium  1 packet Oral Daily   Continuous Infusions:  PRN Meds: acetaminophen , nitroGLYCERIN, ondansetron  (ZOFRAN ) IV   Vital Signs  Vitals:   09/08/24 0530 09/08/24 0615 09/08/24 0622 09/08/24 0705  BP: 104/65 (!) 103/53    Pulse: 69 67    Resp: 15 12    Temp:   98.4 F (36.9 C)   TempSrc:   Oral   SpO2: 100% 98%  98%  Weight:      Height:       No intake or output data in the 24 hours ending 09/08/24 0913    09/07/2024   11:30 PM 09/07/2024    2:38 PM 09/01/2024    2:57 PM  Last 3 Weights  Weight (lbs) 250 lb 250 lb 249 lb 14.4 oz  Weight (kg) 113.399 kg 113.399 kg 113.354 kg      Telemetry Sinus - Personally Reviewed  ECG  No AM tracing - Personally Reviewed  Physical Exam  GEN: No acute distress.   Neck: No JVD Cardiac: RRR, no murmurs, rubs, or gallops.  Respiratory: Clear to auscultation bilaterally. GI: Soft, nontender, non-distended  MS: No edema; No deformity. Neuro:  Nonfocal  Psych: Normal affect   Labs High Sensitivity Troponin:   Recent Labs  Lab 09/07/24 1613 09/07/24 1915  TROPONINIHS 7 7     Chemistry Recent Labs  Lab 09/01/24 1429 09/07/24 1613 09/08/24 0341  NA 139 139 142  K 4.1 3.5 3.6  CL 104 105 108  CO2 24 24 23   GLUCOSE 96 96 89  BUN 8 7 7   CREATININE 0.72 0.85 0.78  CALCIUM  9.7 8.9 8.7*  PROT 7.1  --   --   ALBUMIN 4.4  --   --   AST 23  --   --   ALT 23  --   --   ALKPHOS 83  --   --   BILITOT 0.8  --   --   GFRNONAA >60 >60 >60  ANIONGAP 12 10 11     Lipids No results for input(s): CHOL, TRIG, HDL,  LABVLDL, LDLCALC, CHOLHDL in the last 168 hours.  Hematology Recent Labs  Lab 09/01/24 1429 09/07/24 1613 09/08/24 0341  WBC 7.8 6.9 5.4  RBC 4.86 4.76 4.26  HGB 14.1 13.8 12.4  HCT 41.5 41.8 37.1  MCV 85.4 87.8 87.1  MCH 29.0 29.0 29.1  MCHC 34.0 33.0 33.4  RDW 12.3 12.3 12.4  PLT 272 246 219   Thyroid   Recent Labs  Lab 09/01/24 1428  TSH 4.060    BNPNo results for input(s): BNP, PROBNP in the last 168 hours.  DDimer  Recent Labs  Lab 09/07/24 1613  DDIMER 0.39     Radiology  DG Chest 2 View Result Date: 09/07/2024 EXAM: 2 VIEW(S) XRAY OF THE CHEST 09/07/2024 04:34:00 PM COMPARISON: None available. CLINICAL HISTORY: cp FINDINGS: LUNGS AND PLEURA: No focal pulmonary opacity. No pulmonary edema. No pleural effusion. No pneumothorax. HEART AND MEDIASTINUM: The cardiac silhouette, mediastinal, and hilar contours are normal  and stable. BONES AND SOFT TISSUES: No acute osseous abnormality. IMPRESSION: 1. No acute cardiopulmonary process. Electronically signed by: Maude Stammer MD 09/07/2024 04:59 PM EST RP Workstation: [id number]    Patient Profile   43 y.o. female with history of hypothyroidism admitted with chest pain. She was seen in our office yesterday by Dr. Pietro and given her chest pain and EKG changes was admitted. Troponin negative.   Assessment & Plan   Unstable angina: Concern for unstable angina with symptoms and EKG changes. Continue ASA and statin. Plan for cardiac cath today with possible PCI  I have reviewed the risks, indications, and alternatives to cardiac catheterization, possible angioplasty, and stenting with the patient. Risks include but are not limited to bleeding, infection, vascular injury, stroke, myocardial infection, arrhythmia, kidney injury, radiation-related injury in the case of prolonged fluoroscopy use, emergency cardiac surgery, and death. The patient understands the risks of serious complication is 1-2 in 1000 with  diagnostic cardiac cath and 1-2% or less with angioplasty/stenting.  HLD: Continue statin. Lipitor has been increased.   For questions or updates, please contact [facility] Please consult [email or web address] for contact info under    Signed, Lonni Cash, MD  09/08/2024, 9:13 AM

## 2024-09-08 NOTE — Progress Notes (Signed)
 Received sign out that Dr. Anner suggested keeping overnight due to late nature of case and 2D echo in AM.

## 2024-09-09 ENCOUNTER — Inpatient Hospital Stay (HOSPITAL_COMMUNITY)

## 2024-09-09 ENCOUNTER — Ambulatory Visit: Payer: Self-pay | Admitting: Physician Assistant

## 2024-09-09 ENCOUNTER — Encounter (HOSPITAL_COMMUNITY): Payer: Self-pay | Admitting: Cardiology

## 2024-09-09 DIAGNOSIS — R079 Chest pain, unspecified: Secondary | ICD-10-CM | POA: Diagnosis not present

## 2024-09-09 DIAGNOSIS — I200 Unstable angina: Secondary | ICD-10-CM | POA: Diagnosis not present

## 2024-09-09 LAB — ECHOCARDIOGRAM COMPLETE
Area-P 1/2: 3.48 cm2
Height: 71 in
S' Lateral: 2.8 cm
Weight: 4000 [oz_av]

## 2024-09-09 LAB — LIPOPROTEIN A (LPA): Lipoprotein (a): 63.1 nmol/L — ABNORMAL HIGH (ref <75.0)

## 2024-09-09 MED ORDER — PERFLUTREN LIPID MICROSPHERE
1.0000 mL | INTRAVENOUS | Status: AC | PRN
  Administered 2024-09-09: 3 mL via INTRAVENOUS

## 2024-09-09 NOTE — Discharge Summary (Addendum)
 Discharge Summary   Patient ID: Sarah Hess MRN: [id number]; DOB: [birth year]  Admit date: 09/07/2024 Discharge date: 09/09/2024  PCP:  Thurmond Cathlyn LABOR., MD   [facility] Providers Cardiologist:  Redell Shallow, MD     Discharge Diagnoses  Principal Problem:   Chest pain  Diagnostic Studies/Procedures   Cath: 09/08/2024    Mid LAD to Dist LAD lesion is 35% stenosed.   The left ventricular systolic function is normal.  The left ventricular ejection fraction is 55-65% by visual estimate.  LV end diastolic pressure is normal.  There is no aortic valve stenosis.   Dominance: Right     In the absence of any other complications or medical issues, we expect the patient to be ready for discharge from a cath perspective on 09/09/2024.   Consider nonanginal etiology for chest pain       Alm Clay, MD _____________   History of Present Illness   Sarah Hess is a 43 y.o. female with PHM of hypothyroidism who was initially seen in the office on 11/10 with Dr. Shallow as a new patient and sent to the ED given concerning symptoms and abnormal EKG with TWI in lateral leads. In the ED labs WNL, negative troponin x2. She was admitted for further management with plans for cardiac cath.    Hospital Course    Chest pain -- admitted initially for concerning unstable angina. hsTn negative x2, EKG showed TWI in lateral leads. Underwent cardiac cath with minimal CAD of 35% in m/dLAD. LV gram with LVEF of 55-65% and no aortic stenosis.  -- Echo with formal read pending, review by MD with normal LVEF and no rWMA   Hypothyroidism -- continue levothyroxine  25mcg  General: Well developed, well nourished, female appearing in no acute distress. Head: Normocephalic, atraumatic.  Neck: Supple without bruits, JVD. Lungs:  Resp regular and unlabored, CTA. Heart: RRR, S1, S2, no S3, S4, or murmur; no rub. Abdomen: Soft, non-tender, non-distended with normoactive bowel sounds.   Extremities: No clubbing, cyanosis, edema. Distal pedal pulses are 2+ bilaterally. Right cath site stable without bruising or hematoma Neuro: Alert and oriented X 3. Moves all extremities spontaneously. Psych: Normal affect. _____________  Discharge Vitals Blood pressure 102/63, pulse 67, temperature 98.1 F (36.7 C), temperature source Oral, resp. rate 20, height 5' 11 (1.803 m), weight 113.4 kg, last menstrual period 08/28/2024, SpO2 97%, unknown if currently breastfeeding.  Filed Weights   09/07/24 2330  Weight: 113.4 kg    Labs & Radiologic Studies  CBC Recent Labs    09/07/24 1613 09/08/24 0341  WBC 6.9 5.4  NEUTROABS 4.6  --   HGB 13.8 12.4  HCT 41.8 37.1  MCV 87.8 87.1  PLT 246 219   Basic Metabolic Panel Recent Labs    88/89/74 1613 09/08/24 0341  NA 139 142  K 3.5 3.6  CL 105 108  CO2 24 23  GLUCOSE 96 89  BUN 7 7  CREATININE 0.85 0.78  CALCIUM  8.9 8.7*   Liver Function Tests No results for input(s): AST, ALT, ALKPHOS, BILITOT, PROT, ALBUMIN in the last 72 hours. No results for input(s): LIPASE, AMYLASE in the last 72 hours. High Sensitivity Troponin:   Recent Labs  Lab 09/07/24 1613 09/07/24 1915  TROPONINIHS 7 7    No results for input(s): TRNPT in the last 720 hours.  BNP Invalid input(s): POCBNP No results for input(s): PROBNP in the last 72 hours.  No results for input(s): BNP in the last 72 hours.  D-Dimer Recent Labs    09/07/24 1613  DDIMER 0.39   Hemoglobin A1C No results for input(s): HGBA1C in the last 72 hours. Fasting Lipid Panel No results for input(s): CHOL, HDL, LDLCALC, TRIG, CHOLHDL, LDLDIRECT in the last 72 hours. Lipoprotein (a)  Date/Time Value Ref Range Status  09/08/2024 03:41 AM 63.1 (H) <75.0 nmol/L Final    Comment:    (NOTE) This test was developed and its performance characteristics determined by [facility]. It has not been cleared or approved by the Food and Drug  Administration. Note:  Values greater than or equal to 75.0 nmol/L may       indicate an independent risk factor for CHD,       but must be evaluated with caution when applied       to non-Caucasian populations due to the       influence of genetic factors on Lp(a) across       ethnicities. Performed At: [facility] [address] [place], [place] [id number] Jennette Shorter MD Ey:[id number]     Thyroid  Function Tests No results for input(s): TSH, T4TOTAL, T3FREE, THYROIDAB in the last 72 hours.  Invalid input(s): FREET3 _____________  CARDIAC CATHETERIZATION Result Date: 09/08/2024 Table formatting from the original result was not included. Images from the original result were not included.   Mid LAD to Dist LAD lesion is 35% stenosed.   The left ventricular systolic function is normal.  The left ventricular ejection fraction is 55-65% by visual estimate.  LV end diastolic pressure is normal.  There is no aortic valve stenosis. Dominance: Right   In the absence of any other complications or medical issues, we expect the patient to be ready for discharge from a cath perspective on 09/09/2024.   Consider nonanginal etiology for chest pain Alm Clay, MD  DG Chest 2 View Result Date: 09/07/2024 EXAM: 2 VIEW(S) XRAY OF THE CHEST 09/07/2024 04:34:00 PM COMPARISON: None available. CLINICAL HISTORY: cp FINDINGS: LUNGS AND PLEURA: No focal pulmonary opacity. No pulmonary edema. No pleural effusion. No pneumothorax. HEART AND MEDIASTINUM: The cardiac silhouette, mediastinal, and hilar contours are normal and stable. BONES AND SOFT TISSUES: No acute osseous abnormality. IMPRESSION: 1. No acute cardiopulmonary process. Electronically signed by: Maude Stammer MD 09/07/2024 04:59 PM EST RP Workstation: [id number]    Disposition Pt is being discharged home today in good condition.  Follow-up Plans & Appointments  Discharge Instructions     Call MD for:  redness, tenderness,  or signs of infection (pain, swelling, redness, odor or green/yellow discharge around incision site)   Complete by: As directed    Diet - low sodium heart healthy   Complete by: As directed    Discharge instructions   Complete by: As directed    Radial Site Care Refer to this sheet in the next few weeks. These instructions provide you with information on caring for yourself after your procedure. Your caregiver may also give you more specific instructions. Your treatment has been planned according to current medical practices, but problems sometimes occur. Call your caregiver if you have any problems or questions after your procedure. HOME CARE INSTRUCTIONS You may shower the day after the procedure. Remove the bandage (dressing) and gently wash the site with plain soap and water. Gently pat the site dry.  Do not apply powder or lotion to the site.  Do not submerge the affected site in water for 3 to 5 days.  Inspect the site at least twice daily.  Do not flex  or bend the affected arm for 24 hours.  No lifting over 5 pounds (2.3 kg) for 5 days after your procedure.  Do not drive home if you are discharged the same day of the procedure. Have someone else drive you.  You may drive 24 hours after the procedure unless otherwise instructed by your caregiver.  What to expect: Any bruising will usually fade within 1 to 2 weeks.  Blood that collects in the tissue (hematoma) may be painful to the touch. It should usually decrease in size and tenderness within 1 to 2 weeks.  SEEK IMMEDIATE MEDICAL CARE IF: You have unusual pain at the radial site.  You have redness, warmth, swelling, or pain at the radial site.  You have drainage (other than a small amount of blood on the dressing).  You have chills.  You have a fever or persistent symptoms for more than 72 hours.  You have a fever and your symptoms suddenly get worse.  Your arm becomes pale, cool, tingly, or numb.  You have heavy bleeding from the  site. Hold pressure on the site.   Increase activity slowly   Complete by: As directed        Discharge Medications Allergies as of 09/09/2024       Reactions   Pineapple Anaphylaxis   Only while pregnant, able to eat it now.   Wound Dressing Adhesive Itching   Tegaderm, cardiac monitor patches Redness in placement area and skin break down when left on too long        Medication List     TAKE these medications    atorvastatin 20 MG tablet Commonly known as: LIPITOR Take 20 mg by mouth daily.   ferrous sulfate  325 (65 FE) MG tablet Take 1 tablet (325 mg total) daily with breakfast by mouth.   levothyroxine  25 MCG tablet Commonly known as: SYNTHROID  Take 25 mcg by mouth every evening.   Oyster Shell Calcium  w/D 500-5 MG-MCG Tabs Take 1 tablet by mouth daily.   Physicians EZ Use B-12 1000 MCG/ML Kit Generic drug: Cyanocobalamin  Inject 1 kit into the muscle every 30 (thirty) days.   prenatal multivitamin Tabs tablet Take 1 tablet by mouth daily.   psyllium 0.52 g capsule Commonly known as: REGULOID Take 4 capsules by mouth daily.         Outstanding Labs/Studies  N/a   Duration of Discharge Encounter: APP Time: 20 minutes   Signed, Manuelita Rummer, NP 09/09/2024, 2:11 PM  Sarah Hess was seen by me today along with Manuelita Rummer, NP. I have personally performed an evaluation on this patient.  My findings are as follows:  43 y.o. female with history of hypothyroidism admitted with chest pain. Troponin negative. Cardiac cath with mild CAD. No chest pain today  Data: EKG(s) and pertinent labs, studies, etc were personally reviewed and interpreted by me:  Telemetry reviewed by me: sinus All labs reviewed by me Otherwise, I agree with data as outlined by the advanced practice provider.  Exam performed by me: Gen: NAD Neck: No JVD Cardiac: RRR without murmur Lungs: clear bilaterally Extremities: No LE edema  My Assessment and  Plan:  Chest pain: Her pain is not felt be cardiac. Cardiac cath with mild CAD. Continue ASA and statin. Echo with normal LV function by my read.   The patient will be discharged home today  I spent 30 minutes seeing the patient. During that time, I reviewed the labs, telemetry,  cath films,  echo images, evaluated  their symptoms and performed an examination. This time also included plan formulation, discussion of the plan with the patient and the time spent with documentation.   Signed,  Lonni Cash, MD  09/09/2024 2:11 PM

## 2024-09-09 NOTE — Progress Notes (Signed)
 Echocardiogram 2D Echocardiogram has been performed.  Sarah Hess [name] RDCS 09/09/2024, 11:39 AM

## 2024-09-18 ENCOUNTER — Ambulatory Visit: Attending: Emergency Medicine | Admitting: Emergency Medicine

## 2024-09-18 ENCOUNTER — Encounter: Payer: Self-pay | Admitting: Emergency Medicine

## 2024-09-18 ENCOUNTER — Ambulatory Visit: Attending: Emergency Medicine

## 2024-09-18 VITALS — BP 106/72 | HR 66 | Ht 71.0 in | Wt 249.0 lb

## 2024-09-18 DIAGNOSIS — R002 Palpitations: Secondary | ICD-10-CM

## 2024-09-18 DIAGNOSIS — Z79899 Other long term (current) drug therapy: Secondary | ICD-10-CM

## 2024-09-18 DIAGNOSIS — R072 Precordial pain: Secondary | ICD-10-CM | POA: Diagnosis not present

## 2024-09-18 DIAGNOSIS — I2510 Atherosclerotic heart disease of native coronary artery without angina pectoris: Secondary | ICD-10-CM | POA: Diagnosis not present

## 2024-09-18 DIAGNOSIS — E785 Hyperlipidemia, unspecified: Secondary | ICD-10-CM

## 2024-09-18 MED ORDER — ATORVASTATIN CALCIUM 40 MG PO TABS: 40.0000 mg | ORAL_TABLET | Freq: Every day | ORAL | 3 refills | Status: AC

## 2024-09-18 NOTE — Progress Notes (Signed)
 Cardiology Office Note:    Date:  09/18/2024  ID:  Edsel Mura, DOB 02/09/1981, MRN [id number] PCP: Thurmond Cathlyn LABOR., MD  [facility] Providers Cardiologist:  Redell Shallow, MD Cardiology APP:  Rana Lum CROME, NP       Patient Profile:       Chief Complaint: Follow-up cardiac catheterization History of Present Illness:  Sarah Hess is a 43 y.o. female with visit-pertinent history of coronary artery disease, hypothyroidism  Established with Dr. Shallow on 09/07/2024 for evaluation of palpitations and chest pain.  She previously followed with Dr. Raylene at [facility]  Apparently had a abnormal calcium  score in 2015 but report was not available.  Echocardiogram 04/2022 at [facility] with normal LV function.  Monitor 04/2022 at [facility] without significant arrhythmias per patient but report not available.  Patient stated the past 2 months she has had occasional chest pressure.  Her EKG showed lateral T wave inversion which is new compared to previous.  She was sent to the ED given her concerning symptoms.  In the ED labs were within normal limits, negative troponin x 2.  She was admitted for further management with plans for cardiac cath.  She underwent cardiac catheterization that showed minimal CAD of 35% in the mid to distal LAD.  Echocardiogram with LVEF 65 to 70%, no RWMA, normal diastolic parameters, RV function and size normal, no valvular abnormalities.  Her pain was not felt to be cardiac.  She was continued on aspirin  and statin.   Discussed the use of AI scribe software for clinical note transcription with the patient, who gave verbal consent to proceed.  History of Present Illness Sarah Hess is a 43 year old female with coronary artery disease who presents with intermittent chest pain and palpitations.  She experiences intermittent chest pain described as a 'big squeeze' and palpitations with a sensation of the heart 'forgetting how to beat.' These episodes occur  sporadically without a clear trigger and are not related to physical activity or stress. The chest pain resolves after a few minutes without nausea, sweating, or syncope.  She has premature ventricular contractions (PVCs) perceived as skipped beats, sometimes associated with dizziness, particularly occurring in an every other beat rhythm.  Dizziness is infrequent and occurs if PVCs persist.  She is able to monitor her PVCs in the past with her Kardia mobile device.  Family history includes significant cardiac events, with a sister requiring stents and a father who died from a 100% LAD occlusion. She takes atorvastatin  20 mg daily since 2014 for hyperlipidemia and family history of coronary artery disease. Her LDL was last recorded at 166 mg/dL, with good triglycerides and slightly low HDL.  She experienced supraventricular tachycardia (SVT) during her first pregnancy, managed with vagal maneuvers and adenosine, but has not had similar episodes since. Her current symptoms do not resemble past SVT episodes.  She denies syncope, presyncope, orthopnea, PND, dyspnea, LEE   Review of systems:  Please see the history of present illness. All other systems are reviewed and otherwise negative.      Studies Reviewed:    EKG Interpretation Date/Time:  Friday September 18 2024 14:10:15 EST Ventricular Rate:  66 PR Interval:  136 QRS Duration:  92 QT Interval:  440 QTC Calculation: 461 R Axis:   -24  Text Interpretation: Normal sinus rhythm Normal ECG When compared with ECG of 07-Sep-2024 16:11, No significant change was found Confirmed by Rana Lum [phone]) on 09/18/2024 2:26:22 PM    Echocardiogram 09/09/2024  1. Left ventricular ejection fraction, by estimation, is 65 to 70%. The  left ventricle has normal function. The left ventricle has no regional  wall motion abnormalities. Left ventricular diastolic parameters were  normal.   2. Right ventricular systolic function is normal. The  right ventricular  size is normal. Tricuspid regurgitation signal is inadequate for assessing  PA pressure.   3. The mitral valve is normal in structure. No evidence of mitral valve  regurgitation. No evidence of mitral stenosis.   4. The aortic valve is tricuspid. Aortic valve regurgitation is not  visualized. No aortic stenosis is present.   5. The inferior vena cava is normal in size with <50% respiratory  variability, suggesting right atrial pressure of 8 mmHg.   Cardiac catheterization 09/08/2024   Mid LAD to Dist LAD lesion is 35% stenosed.   The left ventricular systolic function is normal.  The left ventricular ejection fraction is 55-65% by visual estimate.  LV end diastolic pressure is normal.  There is no aortic valve stenosis. Diagnostic Dominance: Right  Risk Assessment/Calculations:              Physical Exam:   VS:  BP 106/72 (BP Location: Left Arm, Patient Position: Sitting, Cuff Size: Large)   Pulse 66   Ht 5' 11 (1.803 m)   Wt 249 lb (112.9 kg)   LMP 08/28/2024   BMI 34.73 kg/m    Wt Readings from Last 3 Encounters:  09/18/24 249 lb (112.9 kg)  09/07/24 250 lb (113.4 kg)  09/07/24 250 lb (113.4 kg)    GEN: Well nourished, well developed in no acute distress NECK: No JVD; No carotid bruits CARDIAC: RRR, no murmurs, rubs, gallops RESPIRATORY:  Clear to auscultation without rales, wheezing or rhonchi  ABDOMEN: Soft, non-tender, non-distended EXTREMITIES:  No edema; No acute deformity      Assessment and Plan:  Coronary artery disease LHC 08/2024 shows mid LAD to Distal LAD is 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  The pain resolves within just a couple of minutes without radiation or associated symptoms - Her symptoms are atypical for active angina.  Her recent LHC and echo were reassuring.  I will place a cardiac monitor given her description of her chest pain - Continue aspirin  and atorvastatin   Palpitations Reportedly had heart monitor in 2023 with [facility] but unable to view - She has recorded strips of PVCs on her home Kardia device - Today she describes palpitations and skipped beats sometimes associated with dizziness and reports episodes where she can feel a PVC every other beat - Reports history of 1 episode of sustained supraventricular tachycardia requiring adenosine during one of her pregnancys several years ago - Plan for 7-day ZIO to monitor PVC burden  - TSH WNL on 07/2024.  BMET and MAG today  Hyperlipidemia, LDL goal <70 LDL 166 on 03/2024 and uncontrolled Lipoprotein (a) 63.1 on 08/2024 - Plan to increase atorvastatin  from 20 to 40 mg daily - Will repeat fasting lipid panel/LFTs in 8 weeks  Hypothyroidism TSH 4.060 on 08/2024 - Managed on levothyroxine  25 mcg daily  Dispo:  Return in about 3 months (around 12/19/2024).  Signed, Lum LITTIE Louis, NP

## 2024-09-18 NOTE — Progress Notes (Unsigned)
Enrolled patient for a 7 day Zio XT monitor to be mailed to patients home   [place] to read 

## 2024-09-18 NOTE — Patient Instructions (Addendum)
 Medication Instructions:  INCREASE ATORVASTATIN  TO 40 MG DAILY.  Lab Work: BMET, FASTING LIPID PANEL, AND MAGNESIUM  TO BE DONE TODAY. REPEAT FASTING LIPID PANEL TO BE DONE IN 8 WEEKS.  Testing/Procedures: GEOFFRY HEWS- Long Term Monitor Instructions  Your physician has requested you wear a ZIO patch monitor for 7 days.  This is a single patch monitor. [employer] supplies one patch monitor per enrollment. Additional stickers are not available. Please do not apply patch if you will be having a Nuclear Stress Test,  Echocardiogram, Cardiac CT, MRI, or Chest Xray during the period you would be wearing the  monitor. The patch cannot be worn during these tests. You cannot remove and re-apply the  ZIO XT patch monitor.  Your ZIO patch monitor will be mailed 3 day USPS to your address on file. It may take 3-5 days  to receive your monitor after you have been enrolled.  Once you have received your monitor, please review the enclosed instructions. Your monitor  has already been registered assigning a specific monitor serial # to you.  Billing and Patient Assistance Program Information  We have supplied [employer] with any of your insurance information on file for billing purposes. [employer] offers a sliding scale Patient Assistance Program for patients that do not have  insurance, or whose insurance does not completely cover the cost of the ZIO monitor.  You must apply for the Patient Assistance Program to qualify for this discounted rate.  To apply, please call [employer] at [phone], select option 4, select option 2, ask to apply for  Patient Assistance Program. Meredeth will ask your household income, and how many people  are in your household. They will quote your out-of-pocket cost based on that information.  [employer] will also be able to set up a 87-month, interest-free payment plan if needed.  Applying the monitor   Shave hair from upper left chest.  Hold abrader disc by orange tab. Rub abrader in  40 strokes over the upper left chest as  indicated in your monitor instructions.  Clean area with 4 enclosed alcohol pads. Let dry.  Apply patch as indicated in monitor instructions. Patch will be placed under collarbone on left  side of chest with arrow pointing upward.  Rub patch adhesive wings for 2 minutes. Remove white label marked 1. Remove the white  label marked 2. Rub patch adhesive wings for 2 additional minutes.  While looking in a mirror, press and release button in center of patch. A small green light will  flash 3-4 times. This will be your only indicator that the monitor has been turned on.  Do not shower for the first 24 hours. You may shower after the first 24 hours.  Press the button if you feel a symptom. You will hear a small click. Record Date, Time and  Symptom in the Patient Logbook.  When you are ready to remove the patch, follow instructions on the last 2 pages of Patient  Logbook. Stick patch monitor onto the last page of Patient Logbook.  Place Patient Logbook in the blue and white box. Use locking tab on box and tape box closed  securely. The blue and white box has prepaid postage on it. Please place it in the mailbox as  soon as possible. Your physician should have your test results approximately 7 days after the  monitor has been mailed back to [facility].  Call [facility] Customer Care at [phone] if you have questions regarding  your ZIO XT patch monitor.  Call them immediately if you see an orange light blinking on your  monitor.  If your monitor falls off in less than 4 days, contact our Monitor department at [phone].  If your monitor becomes loose or falls off after 4 days call [employer] at [phone] for  suggestions on securing your monitor   Follow-Up: At [facility], you and your health needs are our priority.  As part of our continuing mission to provide you with exceptional heart care, our providers are all part of  one team.  This team includes your primary Cardiologist (physician) and Advanced Practice Providers or APPs (Physician Assistants and Nurse Practitioners) who all work together to provide you with the care you need, when you need it.  Your next appointment:   3 MONTHS  Provider:   Redell Shallow, MD OR Lum Louis, NP

## 2024-09-19 LAB — BASIC METABOLIC PANEL WITH GFR
BUN/Creatinine Ratio: 11 (ref 9–23)
BUN: 8 mg/dL (ref 6–24)
CO2: 24 mmol/L (ref 20–29)
Calcium: 9.5 mg/dL (ref 8.7–10.2)
Chloride: 103 mmol/L (ref 96–106)
Creatinine, Ser: 0.73 mg/dL (ref 0.57–1.00)
Glucose: 84 mg/dL (ref 70–99)
Potassium: 4 mmol/L (ref 3.5–5.2)
Sodium: 140 mmol/L (ref 134–144)
eGFR: 105 mL/min/1.73 (ref >59)

## 2024-09-19 LAB — MAGNESIUM: Magnesium: 2.2 mg/dL (ref 1.6–2.3)

## 2024-09-21 ENCOUNTER — Ambulatory Visit: Payer: Self-pay | Admitting: Emergency Medicine

## 2024-10-20 DIAGNOSIS — R002 Palpitations: Secondary | ICD-10-CM

## 2024-11-02 ENCOUNTER — Encounter: Payer: Self-pay | Admitting: Hematology and Oncology

## 2024-11-25 ENCOUNTER — Encounter: Payer: Self-pay | Admitting: Hematology and Oncology

## 2024-11-26 MED ORDER — METOPROLOL SUCCINATE ER 25 MG PO TB24: 12.5000 mg | ORAL_TABLET | Freq: Every day | ORAL | 3 refills | Status: DC

## 2024-12-01 ENCOUNTER — Inpatient Hospital Stay

## 2024-12-01 ENCOUNTER — Inpatient Hospital Stay: Attending: Hematology and Oncology

## 2024-12-01 ENCOUNTER — Other Ambulatory Visit: Payer: Self-pay

## 2024-12-01 ENCOUNTER — Inpatient Hospital Stay: Admitting: Hematology and Oncology

## 2024-12-01 ENCOUNTER — Other Ambulatory Visit: Payer: Self-pay | Admitting: Hematology and Oncology

## 2024-12-01 ENCOUNTER — Telehealth: Payer: Self-pay | Admitting: Hematology and Oncology

## 2024-12-01 VITALS — BP 111/44 | HR 60 | Temp 98.2°F | Resp 16 | Ht 71.0 in | Wt 249.7 lb

## 2024-12-01 DIAGNOSIS — D509 Iron deficiency anemia, unspecified: Secondary | ICD-10-CM | POA: Diagnosis not present

## 2024-12-01 LAB — CMP (CANCER CENTER ONLY)
ALT: 19 U/L (ref 0–44)
AST: 23 U/L (ref 15–41)
Albumin: 4.3 g/dL (ref 3.5–5.0)
Alkaline Phosphatase: 78 U/L (ref 38–126)
Anion gap: 9 (ref 5–15)
BUN: 8 mg/dL (ref 6–20)
CO2: 25 mmol/L (ref 22–32)
Calcium: 9.3 mg/dL (ref 8.9–10.3)
Chloride: 105 mmol/L (ref 98–111)
Creatinine: 0.87 mg/dL (ref 0.44–1.00)
GFR, Estimated: >60 mL/min
Glucose, Bld: 94 mg/dL (ref 70–99)
Potassium: 4.5 mmol/L (ref 3.5–5.1)
Sodium: 139 mmol/L (ref 135–145)
Total Bilirubin: 0.9 mg/dL (ref 0.0–1.2)
Total Protein: 7 g/dL (ref 6.5–8.1)

## 2024-12-01 LAB — CBC WITH DIFFERENTIAL (CANCER CENTER ONLY)
Abs Immature Granulocytes: 0 10*3/uL (ref 0.00–0.07)
Basophils Absolute: 0 10*3/uL (ref 0.0–0.1)
Basophils Relative: 1 %
Eosinophils Absolute: 0 10*3/uL (ref 0.0–0.5)
Eosinophils Relative: 1 %
HCT: 41.5 % (ref 36.0–46.0)
Hemoglobin: 13.8 g/dL (ref 12.0–15.0)
Immature Granulocytes: 0 %
Lymphocytes Relative: 33 %
Lymphs Abs: 1.6 10*3/uL (ref 0.7–4.0)
MCH: 28.8 pg (ref 26.0–34.0)
MCHC: 33.3 g/dL (ref 30.0–36.0)
MCV: 86.6 fL (ref 80.0–100.0)
Monocytes Absolute: 0.4 10*3/uL (ref 0.1–1.0)
Monocytes Relative: 8 %
Neutro Abs: 2.9 10*3/uL (ref 1.7–7.7)
Neutrophils Relative %: 57 %
Platelet Count: 222 10*3/uL (ref 150–400)
RBC: 4.79 MIL/uL (ref 3.87–5.11)
RDW: 12 % (ref 11.5–15.5)
WBC Count: 5 10*3/uL (ref 4.0–10.5)
nRBC: 0 % (ref 0.0–0.2)

## 2024-12-01 LAB — IRON AND TIBC
Iron: 76 ug/dL (ref 28–170)
Saturation Ratios: 18 % (ref 10.4–31.8)
TIBC: 416 ug/dL (ref 250–450)
UIBC: 340 ug/dL

## 2024-12-01 LAB — FOLATE: Folate: 14 ng/mL

## 2024-12-01 LAB — TSH: TSH: 4.02 u[IU]/mL (ref 0.350–4.500)

## 2024-12-01 LAB — FERRITIN: Ferritin: 37 ng/mL (ref 11–307)

## 2024-12-01 LAB — VITAMIN B12: Vitamin B-12: 225 pg/mL (ref 180–914)

## 2024-12-01 MED ORDER — METOPROLOL SUCCINATE ER 25 MG PO TB24: 12.5000 mg | ORAL_TABLET | Freq: Every day | ORAL | 3 refills | Status: AC

## 2024-12-22 ENCOUNTER — Ambulatory Visit: Payer: Self-pay | Admitting: Emergency Medicine

## 2025-03-01 ENCOUNTER — Inpatient Hospital Stay: Admitting: Hematology and Oncology

## 2025-03-01 ENCOUNTER — Inpatient Hospital Stay
